# Patient Record
Sex: Male | Born: 2000 | Race: Black or African American | Hispanic: No | Marital: Single
Health system: Southern US, Community
[De-identification: ages and names within clinical notes are randomized; demographics above are authoritative.]

## PROBLEM LIST (undated history)

## (undated) ENCOUNTER — Emergency Department (HOSPITAL_COMMUNITY): Admission: EM | Payer: Medicaid Other | Source: Home / Self Care

## (undated) DIAGNOSIS — Q180 Sinus, fistula and cyst of branchial cleft: Secondary | ICD-10-CM

---
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
## 2022-03-24 ENCOUNTER — Encounter (HOSPITAL_COMMUNITY): Payer: Self-pay

## 2022-03-24 ENCOUNTER — Ambulatory Visit (HOSPITAL_COMMUNITY)
Admission: EM | Admit: 2022-03-24 | Discharge: 2022-03-24 | Disposition: A | Discharge status: Home / Self Care | Payer: Medicaid Other | Attending: Urgent Care | Admitting: Urgent Care

## 2022-03-24 ENCOUNTER — Other Ambulatory Visit: Payer: Self-pay

## 2022-03-24 ENCOUNTER — Ambulatory Visit (INDEPENDENT_AMBULATORY_CARE_PROVIDER_SITE_OTHER): Payer: Medicaid Other

## 2022-03-24 DIAGNOSIS — M25462 Effusion, left knee: Secondary | ICD-10-CM | POA: Diagnosis not present

## 2022-03-24 DIAGNOSIS — S83422A Sprain of lateral collateral ligament of left knee, initial encounter: Secondary | ICD-10-CM

## 2022-03-24 DIAGNOSIS — M25562 Pain in left knee: Secondary | ICD-10-CM

## 2022-03-24 MED ORDER — IBUPROFEN 600 MG PO TABS: 600.0000 mg | ORAL_TABLET | Freq: Four times a day (QID) | ORAL | 0 refills | Status: DC | PRN

## 2022-03-24 NOTE — Discharge Instructions (Addendum)
You do not have a fracture of your knee. You do have a moderate joint effusion, which is suggestive of soft tissue injury. You likely have an LCL sprain.  Please wear the hinged knee brace until follow-up with sports medicine. Please take the ibuprofen every 6-8 hours as needed for mild pain and swelling, take with food to prevent upset stomach. Please ice the knee 3 times daily for the next 3 days.

## 2022-03-24 NOTE — ED Provider Notes (Signed)
[facility]    CSN: [id number] Arrival date & time: 03/24/22  1436      History   Chief Complaint Chief Complaint  Patient presents with   Knee Pain    HPI Alexander Palmer is a 21 y.o. male.   Pleasant 21 year old male presents today due to concerns of left knee pain after landing wrong on his knee while playing basketball yesterday.  He states a similar episode happened 2 months ago, but he never sought medical care at that time.  Patient states he felt a popping sensation, like his knee went out of socket.  He feels that it was dislocated, but states it went back into place.  He took 2 tablets of Tylenol, and has iced his knee yesterday.  He reports the majority of the pain seems to be on the MCL and LCL.  He is able to walk on it.  He reports tightness and swelling.  He denies bruising.   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   History reviewed. No pertinent surgical history.     Home Medications    Prior to Admission medications   Medication Sig Start Date End Date Taking? Authorizing Provider  ibuprofen (ADVIL) 600 MG tablet Take 1 tablet (600 mg total) by mouth every 6 (six) hours as needed for mild pain (swelling). 03/24/22  Yes Derick Seminara L, PA    Family History Family History  Problem Relation Age of Onset   Hypertension Mother    Asthma Father        as a child   Hypertension Maternal Grandmother    Asthma Brother     Social History Social History   Tobacco Use   Smoking status: Passive Smoke Exposure - Never Smoker   Smokeless tobacco: Never   Tobacco comments:    father smokes outside  Substance Use Topics   Alcohol use: No   Drug use: No     Allergies   Patient has no known allergies.   Review of Systems Review of Systems As per HPI  Physical Exam Triage Vital Signs ED Triage Vitals [03/24/22 1518]  Enc  Vitals Group     BP (!) 111/91     Pulse Rate 80     Resp 18     Temp 97.6 F (36.4 C)     Temp Source Oral     SpO2 98 %     Weight      Height      Head Circumference      Peak Flow      Pain Score      Pain Loc      Pain Edu?      Excl. in GC?    No data found.  Updated Vital Signs BP (!) 111/91 (BP Location: Left Arm)   Pulse 80   Temp 97.6 F (36.4 C) (Oral)   Resp 18   SpO2 98%   Visual Acuity Right Eye Distance:   Left Eye Distance:   Bilateral Distance:    Right Eye Near:   Left Eye Near:    Bilateral Near:     Physical Exam Vitals and nursing note reviewed. Exam conducted with a chaperone present.  Constitutional:      General: He is not in acute distress.    Appearance: Normal appearance. He is normal weight.  He is not ill-appearing, toxic-appearing or diaphoretic.  HENT:     Head: Normocephalic and atraumatic.  Musculoskeletal:     Right knee: Normal. No swelling, deformity, effusion, erythema, ecchymosis, lacerations, bony tenderness or crepitus. Normal range of motion. No tenderness. Normal alignment, normal meniscus and normal patellar mobility.     Instability Tests: Anterior drawer test negative. Posterior drawer test negative. Anterior Lachman test negative. Medial McMurray test negative and lateral McMurray test negative.     Left knee: Swelling, effusion and bony tenderness (medial and lateral epicondyle) present. Tenderness present over the MCL and LCL. No medial joint line, lateral joint line, ACL, PCL or patellar tendon tenderness. LCL laxity and MCL laxity present. No ACL laxity or PCL laxity.Normal pulse.     Instability Tests: Anterior drawer test negative. Posterior drawer test negative. Anterior Lachman test negative. Medial McMurray test negative and lateral McMurray test negative.     Right lower leg: Normal. No swelling. No edema.     Left lower leg: Normal. No swelling. No edema.  Neurological:     Mental Status: He is alert.       UC Treatments / Results  Labs (all labs ordered are listed, but only abnormal results are displayed) Labs Reviewed - No data to display  EKG   Radiology DG Knee Complete 4 Views Left  Result Date: 03/24/2022 CLINICAL DATA:  Left knee pain after playing ball yesterday. EXAM: LEFT KNEE - COMPLETE 4+ VIEW COMPARISON:  None Available. FINDINGS: Moderate joint effusion. Joint spaces are preserved. No acute fracture or dislocation. IMPRESSION: Moderate joint effusion. No acute fracture. Electronically Signed   By: Neita Garnet M.D.   On: 03/24/2022 16:08    Procedures Procedures (including critical care time)  Medications Ordered in UC Medications - No data to display  Initial Impression / Assessment and Plan / UC Course  I have reviewed the triage vital signs and the nursing notes.  Pertinent labs & imaging results that were available during my care of the patient were reviewed by me and considered in my medical decision making (see chart for details).     Sprain of LCL -patient most symptomatic at the LCL.  X-ray unremarkable apart from joint effusion.  Suspect this to be secondary to LCL and less severe MCL injury.  Patient had negative signs for ACL PCL or meniscal injury.  We will place patient in hinged knee brace, give antiinflammatory medication, and recommended follow-up with sports medicine to determine if advanced imaging indicated.  Patient is not to participate in sports until seen by specialist. Knee effusion -secondary to above.  Final Clinical Impressions(s) / UC Diagnoses   Final diagnoses:  Sprain of lateral collateral ligament of left knee, initial encounter  Knee effusion, left     Discharge Instructions      You do not have a fracture of your knee. You do have a moderate joint effusion, which is suggestive of soft tissue injury. You likely have an LCL sprain.  Please wear the hinged knee brace until follow-up with sports medicine. Please take the  ibuprofen every 6-8 hours as needed for mild pain and swelling, take with food to prevent upset stomach. Please ice the knee 3 times daily for the next 3 days.   ED Prescriptions     Medication Sig Dispense Auth. Provider   ibuprofen (ADVIL) 600 MG tablet Take 1 tablet (600 mg total) by mouth every 6 (six) hours as needed for mild pain (swelling). 30 tablet [place], [place]  L, PA      PDMP not reviewed this encounter.   Chaney Malling, [place] 03/24/22 2013

## 2022-03-24 NOTE — ED Triage Notes (Signed)
Pt presents with left knee pain after playing ball yesterday. Pt thinks he need is out of place. Pt is taking tylenol for pain.

## 2022-04-02 ENCOUNTER — Ambulatory Visit (INDEPENDENT_AMBULATORY_CARE_PROVIDER_SITE_OTHER): Payer: Medicaid Other | Admitting: Sports Medicine

## 2022-04-02 VITALS — BP 102/60 | Ht 73.0 in | Wt 170.0 lb

## 2022-04-02 DIAGNOSIS — M25562 Pain in left knee: Secondary | ICD-10-CM | POA: Insufficient documentation

## 2022-04-02 NOTE — Assessment & Plan Note (Signed)
He has some obvious swelling and effusion along with some laxity with ACL testing.  MRI ordered today.  He was also recommended he use a compression sleeve, ice and work on quad strengthening exercises.  We will follow-up with him after MRI results have been obtained.

## 2022-04-02 NOTE — Progress Notes (Signed)
New Patient Office Visit  Subjective   Patient ID: Alexander Palmer, male    DOB: 2001/01/25  Age: 21 y.o. MRN: [id number]  Left knee injury.  Neithan presents today with his mother with chief complaint of left knee injury x2.  He first injured his knee about a month ago while playing basketball when he landed funny and felt like his knee moved in and out.  He noticed some swelling at that time but did not seek medical attention.  He rested and his pain resolved.  He again was playing basketball about a week ago when he came down and landed and again felt that his knee shifted underneath him.  He had immediate swelling.  He was evaluated at urgent care the following day.  He was diagnosed with sprain of his ligaments.  X-rays were negative for any acute fracture.  He presents today for follow-up exam.  He denies any buckling of that knee.  Reports no pain just some tightness.  He has been wearing hinged knee brace that he received from urgent care while up and ambulating.  He is not currently in school.  He denies any numbness or tingling radiating down the leg.    ROS as listed above in HPI    Objective:     BP 102/60   Ht 6\' 1"  (1.854 m)   Wt 170 lb (77.1 kg)   BMI 22.43 kg/m   Physical Exam Vitals reviewed.  Constitutional:      General: He is not in acute distress.    Appearance: Normal appearance. He is normal weight. He is not ill-appearing, toxic-appearing or diaphoretic.  HENT:     Head: Normocephalic.  Pulmonary:     Effort: Pulmonary effort is normal.  Musculoskeletal:        General: Swelling (L knee) present.  Neurological:     Mental Status: He is alert.   Left knee: No obvious deformity or asymmetry.  He does have some edema swelling localized to the medial knee.  No tenderness to palpation along medial or lateral joint line or patellar facet.  Slightly decreased range of motion from approximately -5 to 110 degrees.  Stable to varus and valgus stress.  Negative  Thessaly's.  Negative posterior drawer.  Negative sag sign.  There is some laxity with Lachman's and anterior drawer testing when compared to his right side.  Strength 5/5 resisted knee flexion and extension.  He does have some decreased quad tone when compared to his right.  Normal gait    Assessment & Plan:   Problem List Items Addressed This Visit       Other   Acute pain of left knee - Primary    He has some obvious swelling and effusion along with some laxity with ACL testing.  MRI ordered today.  He was also recommended he use a compression sleeve, ice and work on quad strengthening exercises.  We will follow-up with him after MRI results have been obtained.       Relevant Orders   MR Knee Left  Wo Contrast    Return for results after MRI.    Elmore Guise, DO  Patient seen and evaluated with the sports medicine fellow.  I agree with the above plan of care.  There are some laxity with ACL testing and the patient has a definite joint effusion.  X-rays are normal other than the joint effusion that seen.  We will proceed with an MRI scan specifically to rule  out ACL tear.  Patient and his mother will follow-up with [place] in the office after that study to review those results and delineate a definitive treatment plan.

## 2022-04-09 ENCOUNTER — Ambulatory Visit
Admission: RE | Admit: 2022-04-09 | Discharge: 2022-04-09 | Disposition: A | Discharge status: Home / Self Care | Payer: Medicaid Other | Source: Ambulatory Visit | Attending: Sports Medicine | Admitting: Sports Medicine

## 2022-04-09 DIAGNOSIS — M25562 Pain in left knee: Secondary | ICD-10-CM

## 2022-04-16 ENCOUNTER — Ambulatory Visit (INDEPENDENT_AMBULATORY_CARE_PROVIDER_SITE_OTHER): Payer: Medicaid Other | Admitting: Sports Medicine

## 2022-04-16 VITALS — Ht 74.0 in | Wt 170.0 lb

## 2022-04-16 DIAGNOSIS — S83512A Sprain of anterior cruciate ligament of left knee, initial encounter: Secondary | ICD-10-CM | POA: Diagnosis present

## 2022-04-16 NOTE — Progress Notes (Signed)
Patient ID: Alexander Palmer, male   DOB: 02/07/2001, 21 y.o.   MRN: [id number]  Patient presents today to discuss MRI findings of his left knee.  MRI does confirm an ACL tear.  I recommend surgical consultation with Dr. Rhona Raider.  Patient agrees.  I will defer further work-up and treatment to his discretion and the patient will follow-up with me as needed.  This note was dictated using Dragon naturally speaking software and may contain errors in syntax, spelling, or content which have not been identified prior to signing this note.

## 2022-04-16 NOTE — Patient Instructions (Signed)
[facility] Dr Rhona Raider Wed 04/17/22 @ 3:30p Arrive time is [place] [phone]

## 2023-06-21 ENCOUNTER — Other Ambulatory Visit: Payer: Self-pay

## 2023-06-21 ENCOUNTER — Encounter (HOSPITAL_COMMUNITY): Payer: Self-pay

## 2023-06-21 ENCOUNTER — Emergency Department (HOSPITAL_COMMUNITY)
Admission: EM | Admit: 2023-06-21 | Discharge: 2023-06-21 | Disposition: A | Discharge status: Home / Self Care | Payer: Medicaid Other | Attending: Emergency Medicine | Admitting: Emergency Medicine

## 2023-06-21 DIAGNOSIS — R197 Diarrhea, unspecified: Secondary | ICD-10-CM | POA: Insufficient documentation

## 2023-06-21 DIAGNOSIS — R112 Nausea with vomiting, unspecified: Secondary | ICD-10-CM | POA: Diagnosis present

## 2023-06-21 DIAGNOSIS — R1084 Generalized abdominal pain: Secondary | ICD-10-CM | POA: Insufficient documentation

## 2023-06-21 LAB — URINALYSIS, ROUTINE W REFLEX MICROSCOPIC
Bacteria, UA: NONE SEEN
Bilirubin Urine: NEGATIVE
Glucose, UA: NEGATIVE mg/dL
Hgb urine dipstick: NEGATIVE
Ketones, ur: NEGATIVE mg/dL
Nitrite: NEGATIVE
Protein, ur: NEGATIVE mg/dL
Specific Gravity, Urine: 1.024 (ref 1.005–1.030)
pH: 7 (ref 5.0–8.0)

## 2023-06-21 LAB — COMPREHENSIVE METABOLIC PANEL
ALT: 21 U/L (ref 0–44)
AST: 21 U/L (ref 15–41)
Albumin: 4.5 g/dL (ref 3.5–5.0)
Alkaline Phosphatase: 73 U/L (ref 38–126)
Anion gap: 10 (ref 5–15)
BUN: 11 mg/dL (ref 6–20)
CO2: 24 mmol/L (ref 22–32)
Calcium: 9.5 mg/dL (ref 8.9–10.3)
Chloride: 101 mmol/L (ref 98–111)
Creatinine, Ser: 0.94 mg/dL (ref 0.61–1.24)
GFR, Estimated: >60 mL/min (ref >60)
Glucose, Bld: 109 mg/dL — ABNORMAL HIGH (ref 70–99)
Potassium: 3.5 mmol/L (ref 3.5–5.1)
Sodium: 135 mmol/L (ref 135–145)
Total Bilirubin: 2.6 mg/dL — ABNORMAL HIGH (ref <1.2)
Total Protein: 7.3 g/dL (ref 6.5–8.1)

## 2023-06-21 LAB — CBC WITH DIFFERENTIAL/PLATELET
Abs Immature Granulocytes: 0.03 10*3/uL (ref 0.00–0.07)
Basophils Absolute: 0 10*3/uL (ref 0.0–0.1)
Basophils Relative: 0 %
Eosinophils Absolute: 0 10*3/uL (ref 0.0–0.5)
Eosinophils Relative: 0 %
HCT: 41.6 % (ref 39.0–52.0)
Hemoglobin: 13.9 g/dL (ref 13.0–17.0)
Immature Granulocytes: 0 %
Lymphocytes Relative: 14 %
Lymphs Abs: 1.1 10*3/uL (ref 0.7–4.0)
MCH: 29.6 pg (ref 26.0–34.0)
MCHC: 33.4 g/dL (ref 30.0–36.0)
MCV: 88.7 fL (ref 80.0–100.0)
Monocytes Absolute: 0.4 10*3/uL (ref 0.1–1.0)
Monocytes Relative: 5 %
Neutro Abs: 6.6 10*3/uL (ref 1.7–7.7)
Neutrophils Relative %: 81 %
Platelets: 190 10*3/uL (ref 150–400)
RBC: 4.69 MIL/uL (ref 4.22–5.81)
RDW: 11.3 % — ABNORMAL LOW (ref 11.5–15.5)
WBC: 8.2 10*3/uL (ref 4.0–10.5)
nRBC: 0 % (ref 0.0–0.2)

## 2023-06-21 LAB — I-STAT CHEM 8, ED
BUN: 13 mg/dL (ref 6–20)
Calcium, Ion: 1.03 mmol/L — ABNORMAL LOW (ref 1.15–1.40)
Chloride: 103 mmol/L (ref 98–111)
Creatinine, Ser: 1 mg/dL (ref 0.61–1.24)
Glucose, Bld: 107 mg/dL — ABNORMAL HIGH (ref 70–99)
HCT: 42 % (ref 39.0–52.0)
Hemoglobin: 14.3 g/dL (ref 13.0–17.0)
Potassium: 3.6 mmol/L (ref 3.5–5.1)
Sodium: 139 mmol/L (ref 135–145)
TCO2: 25 mmol/L (ref 22–32)

## 2023-06-21 LAB — LIPASE, BLOOD: Lipase: 19 U/L (ref 11–51)

## 2023-06-21 MED ORDER — ONDANSETRON 4 MG PO TBDP: ORAL_TABLET | ORAL | 0 refills | Status: DC

## 2023-06-21 NOTE — ED Provider Notes (Signed)
[place] EMERGENCY DEPARTMENT AT [facility] Provider Note   CSN: [id number] Arrival date & time: 06/21/23  1041     History  Chief Complaint  Patient presents with   Abdominal Pain    Alexander Palmer is a 22 y.o. male.  22 yo M with a cc of abdominal pain.  This been going on for about 12 to 24 hours.  Diffuse abdominal pain.  Worse with certain positions.  Having some nausea vomiting and diarrhea with it.  Alexander Palmer denies fevers or chills.  Denies suspicious food intake denies recent travel denies anyone around him with a similar illness.  Alexander Palmer did get play hit his abdomen yesterday.  Alexander Palmer said it was with a fist.  Alexander Palmer said it was not very hard.   Abdominal Pain      Home Medications Prior to Admission medications   Medication Sig Start Date End Date Taking? Authorizing Provider  ondansetron (ZOFRAN-ODT) 4 MG disintegrating tablet 4mg  ODT q4 hours prn nausea/vomit 06/21/23  Yes Melene Plan, DO  ibuprofen (ADVIL) 600 MG tablet Take 1 tablet (600 mg total) by mouth every 6 (six) hours as needed for mild pain (swelling). 03/24/22   Guy Sandifer L, PA      Allergies    Patient has no known allergies.    Review of Systems   Review of Systems  Gastrointestinal:  Positive for abdominal pain.    Physical Exam Updated Vital Signs BP 129/80 (BP Location: Right Arm)   Pulse 86   Temp 98.6 F (37 C) (Oral)   Resp 12   Ht 6\' 2"  (1.88 m)   Wt 77.1 kg   SpO2 99%   BMI 21.83 kg/m  Physical Exam Vitals and nursing note reviewed.  Constitutional:      Appearance: Alexander Palmer is well-developed.  HENT:     Head: Normocephalic and atraumatic.  Eyes:     Pupils: Pupils are equal, round, and reactive to light.  Neck:     Vascular: No JVD.  Cardiovascular:     Rate and Rhythm: Normal rate and regular rhythm.     Heart sounds: No murmur heard.    No friction rub. No gallop.  Pulmonary:     Effort: No respiratory distress.     Breath sounds: No wheezing.  Abdominal:     General:  There is no distension.     Tenderness: There is no abdominal tenderness. There is no guarding or rebound.     Comments: Benign abdominal exam  Musculoskeletal:        General: Normal range of motion.     Cervical back: Normal range of motion and neck supple.  Skin:    Coloration: Skin is not pale.     Findings: No rash.  Neurological:     Mental Status: Alexander Palmer is alert and oriented to person, place, and time.  Psychiatric:        Behavior: Behavior normal.     ED Results / Procedures / Treatments   Labs (all labs ordered are listed, but only abnormal results are displayed) Labs Reviewed  COMPREHENSIVE METABOLIC PANEL - Abnormal; Notable for the following components:      Result Value   Glucose, Bld 109 (*)    Total Bilirubin 2.6 (*)    All other components within normal limits  CBC WITH DIFFERENTIAL/PLATELET - Abnormal; Notable for the following components:   RDW 11.3 (*)    All other components within normal limits  URINALYSIS, ROUTINE W REFLEX  MICROSCOPIC - Abnormal; Notable for the following components:   APPearance HAZY (*)    Leukocytes,Ua TRACE (*)    All other components within normal limits  I-STAT CHEM 8, ED - Abnormal; Notable for the following components:   Glucose, Bld 107 (*)    Calcium, Ion 1.03 (*)    All other components within normal limits  LIPASE, BLOOD    EKG None  Radiology No results found.  Procedures Procedures    Medications Ordered in ED Medications - No data to display  ED Course/ Medical Decision Making/ A&P                                 Medical Decision Making Risk Prescription drug management.   22 yo M with a cc of abdominal pain.  Diffuse going on for about 12-24 hours. Patient well appearing and non toxic.  No leukocytosis no significant electrolyte abnormalities LFTs and lipase are unremarkable.  UA negative for infection.  Patient was seen in the MSE process and a CT scan was ordered.  I am not sure that this would benefit  the patient based on his history I suspect most likely this is a viral syndrome.  Alexander Palmer has a benign abdominal exam.  No pain with deep palpation in the right lower and right upper quadrant.  Will treat supportively.  PCP follow-up.  2:24 PM:  I have discussed the diagnosis/risks/treatment options with the patient.  Evaluation and diagnostic testing in the emergency department does not suggest an emergent condition requiring admission or immediate intervention beyond what has been performed at this time.  They will follow up with PCP. We also discussed returning to the ED immediately if new or worsening sx occur. We discussed the sx which are most concerning (e.g., sudden worsening pain, fever, inability to tolerate by mouth) that necessitate immediate return. Medications administered to the patient during their visit and any new prescriptions provided to the patient are listed below.  Medications given during this visit Medications - No data to display   The patient appears reasonably screen and/or stabilized for discharge and I doubt any other medical condition or other [facility] requiring further screening, evaluation, or treatment in the ED at this time prior to discharge.          Final Clinical Impression(s) / ED Diagnoses Final diagnoses:  Nausea vomiting and diarrhea    Rx / DC Orders ED Discharge Orders          Ordered    ondansetron (ZOFRAN-ODT) 4 MG disintegrating tablet        06/21/23 1419              Melene Plan, DO 06/21/23 1424

## 2023-06-21 NOTE — ED Provider Triage Note (Signed)
Emergency Medicine Provider Triage Evaluation Note  Alexander Palmer , a 22 y.o. male  was evaluated in triage.  Pt complains of abdominal pain.  Review of Systems  Positive:  Negative:   Physical Exam  BP 129/80 (BP Location: Right Arm)   Pulse 86   Temp 98.6 F (37 C) (Oral)   Resp 12   Ht 6\' 2"  (1.88 m)   Wt 77.1 kg   SpO2 99%   BMI 21.83 kg/m  Gen:   Awake, no distress   Resp:  Normal effort  MSK:   Moves extremities without difficulty  Other:    Medical Decision Making  Medically screening exam initiated at 11:14 AM.  Appropriate orders placed.  Alexander Palmer was informed that the remainder of the evaluation will be completed by another provider, this initial triage assessment does not replace that evaluation, and the importance of remaining in the ED until their evaluation is complete.  Was playing with brother last night when he got hit in stomach. Complaining of lower abdominal pain - does not radiate into testicles. Had vomiting last night and this morning that has since resolved. No nausea currently. Denies hematemesis. Also reports "a little bit" of diarrhea. 8/10 severity currently. Patient does not appear to be in distress.  Denies fever, chest pain, dyspnea, cough, diarrhea, dysuria, hematuria, hematochezia.   Dorthy Cooler, [place] 06/21/23 1116

## 2023-06-21 NOTE — ED Triage Notes (Signed)
Reports abd pain after getting hit in the abd by his little brother while play fighting.  Reports he has had n/v and difficulty pooping.  Reports pain in left knee from previous acl tear.

## 2023-06-21 NOTE — Discharge Instructions (Signed)
Most commonly this caused by a virus.  Typically last couple days and then goes away on its own.  I prescribed you some medication to help you with nausea.  You can take Imodium for diarrhea.  Please return for sudden worsening pain fever or inability to eat or drink.

## 2023-06-27 ENCOUNTER — Ambulatory Visit (INDEPENDENT_AMBULATORY_CARE_PROVIDER_SITE_OTHER): Payer: Medicaid Other | Admitting: Sports Medicine

## 2023-06-27 VITALS — BP 110/72 | Ht 74.0 in | Wt 160.0 lb

## 2023-06-27 DIAGNOSIS — S83512D Sprain of anterior cruciate ligament of left knee, subsequent encounter: Secondary | ICD-10-CM

## 2023-06-27 NOTE — Progress Notes (Signed)
   PCP: [facility]  SUBJECTIVE:   HPI:  Patient is a 23 y.o. male here with chief complaint of left knee instability. He has a known complete ACL tear on the left from October 2023. He was referred to ortho for operative management though did not go. He moved away to [place] for college this past year, though has recently returned. He is hoping to have his knee re-evaluated as it has continued to give out on him and feels unstable. He denies much pain. He has been using a compression sleeve recently and feels this provides him support. He is pretty active and plays pickup basketball. He is interested in another referral to orthopedics. No history of knee surgery.  ROS:     See HPI  PERTINENT  PMH / PSH / FH / SH:  Past Medical, Surgical, Social, and Family History Reviewed & Updated in the EMR.  Pertinent findings include:  Non-contributory  No past surgical history on file.  No Known Allergies  OBJECTIVE:  BP 110/72   Ht 6' 2 (1.88 m)   Wt 160 lb (72.6 kg)   BMI 20.54 kg/m   PHYSICAL EXAM:  GEN: Alert and Oriented, NAD, comfortable in exam room RESP: Unlabored respirations, symmetric chest rise PSY: normal mood, congruent affect   Left Knee MSK EXAM: No gross deformity, ecchymoses, swelling. No TTP. FROM with normal strength. Positive anterior drawer and Lachman, no firm ACL endpoint on exam. Negative post drawers. Negative laxity with valgus/varus testing. Negative mcmurrays, apleys. NV intact distally. Normal gait.  MRI Left Knee 03/2022 reviewed:  1. Full-thickness ACL tear with associated bone contusions of the lateral femoral condyle and posterior aspect of the lateral tibial plateau consistent with pivot-shift injury. 2. Large knee joint effusion. 3. Edema along the medial collateral ligament concerning for ligamentous sprain without tear. 4. Intermediate signal of the body of the medial meniscus without discrete tear. 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 We discussed recommended management for this involves surgical reconstruction, especially if the patient is active and wishes to return to sports or activities that involve pivoting, cutting, or jumping. Discussed risks of accelerated cartilage loss and further knee injury, including dislocation with subsequent complications if not repaired. Referral placed to Dr. Cristy at [facility]. Patient and mother's questions were answered and they are in agreement with this plan.  Prentice Niece, MD PGY-4, Sports Medicine Fellow [facility]  Addendum:  I was the preceptor for this visit and available for immediate consultation.  Ludie Littler MD [facility]

## 2023-06-27 NOTE — Patient Instructions (Addendum)
 You have a chronic ACL tear of the left knee. Given your age and activity level I strongly recommend this be repaired surgically. I have placed a referral to [facility] to see Dr. Cristy.   Address: [address], [place], [place] [address] Phone: [phone]  Appt: Tuesday 07/15/23 @ 11 am  If you need to reschedule this appt please contact their office.

## 2023-08-06 ENCOUNTER — Other Ambulatory Visit: Payer: Self-pay

## 2023-08-06 ENCOUNTER — Encounter (HOSPITAL_BASED_OUTPATIENT_CLINIC_OR_DEPARTMENT_OTHER): Payer: Self-pay | Admitting: Orthopaedic Surgery

## 2023-08-11 NOTE — H&P (Signed)
PREOPERATIVE H&P  Chief Complaint: LEFT KNEE ACL TEAR, MEDIAL MENISCUS TEAR  HPI: Alexander Palmer is a 23 y.o. male who is scheduled for, Procedure(s): KNEE ARTHROSCOPY WITH ANTERIOR CRUCIATE LIGAMENT (ACL) RECONSTRUCTION KNEE ARTHROSCOPY WITH MEDIAL MENISECTOMY KNEE ARTHROSCOPY WITH MENISCAL REPAIR.   Alexander Palmer is a healthy 23 year old male here for evaluation of left ACL tear.  He initially injured this playing basketball in October 2023.  He was evaluated at [facility] and had an MRI performed, which showed a complete rupture of his ACL.  He had never had significant pain with this, just instability.  Shortly after that he moved to [place], although he recently returned back to [place].  He works as a Customer service manager.  He has continued to have instability sensation while playing basketball, though he does not have significant pain in the knee.  He was referred over by [facility] for evaluation of ACL reconstruction.    Symptoms are rated as moderate to severe, and have been worsening.  This is significantly impairing activities of daily living.    Please see clinic note for further details on this patient's care.    He has elected for surgical management.   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 No pertinent surgical history. Social History   Socioeconomic History   Marital status: Single    Spouse name: Not on file   Number of children: Not on file   Years of education: Not on file   Highest education level: Not on file  Occupational History   Not on file  Tobacco Use   Smoking status: Passive Smoke Exposure - Never Smoker   Smokeless tobacco: Never   Tobacco comments:    father smokes outside  Vaping Use   Vaping status: Never Used  Substance and Sexual Activity   Alcohol use: No   Drug use: No   Sexual activity: Not on file  Other Topics Concern   Not on file  Social  History Narrative   Not on file   Social Drivers of Health   Financial Resource Strain: Not on File (10/11/2021)   Received from [employer], [employer]    Financial Resource Strain: 0  Food Insecurity: Not on File (03/20/2023)   Received from [facility] Insecurity    Food: 0  Transportation Needs: Not on File (10/11/2021)   Received from [employer], [employer] Needs    Transportation: 0  Physical Activity: Not on File (10/11/2021)   Received from [place], [place]   Physical Activity    Physical Activity: 0  Stress: Not on File (10/11/2021)   Received from [facility], [place]   Stress    Stress: 0  Social Connections: Not on File (03/08/2023)   Received from [employer]   Social Connections    Connectedness: 0   Family History  Problem Relation Age of Onset   Hypertension Mother    Asthma Father        as a child   Hypertension Maternal Grandmother    Asthma Brother    No Known Allergies Prior to Admission medications   Medication Sig Start Date End Date Taking? Authorizing Provider  ibuprofen (ADVIL) 600 MG tablet Take 1 tablet (600 mg total) by mouth every 6 (six) hours as needed for mild pain (swelling). 03/24/22   Crain, Whitney L, PA  ondansetron (ZOFRAN-ODT) 4 MG disintegrating tablet  4mg  ODT q4 hours prn nausea/vomit 06/21/23   Melene Plan, DO    ROS: All other systems have been reviewed and were otherwise negative with the exception of those mentioned in the HPI and as above.  Physical Exam: General: Alert, no acute distress Cardiovascular: No pedal edema Respiratory: No cyanosis, no use of accessory musculature GI: No organomegaly, abdomen is soft and non-tender Skin: No lesions in the area of chief complaint Neurologic: Sensation intact distally Psychiatric: Patient is competent for consent with normal mood and affect Lymphatic: No axillary or cervical lymphadenopathy  MUSCULOSKELETAL:  Left knee without effusion or bony deformity.  He is  hypermobile and has range of motion from -15 degrees to 145 degrees.  He has significant laxity without end point on Lachman and anterior drawer.  Firm end point on posterior drawer.  No significant laxity with varus and valgus stress.  Negative McMurray's and Thessaly's.  Knee is nontender to palpation.  Neurovascularly intact distally.    Imaging: MRI demonstrates bucket handle tear of the medial meniscus, complete ACL tear  Assessment: LEFT KNEE ACL TEAR, MEDIAL MENISCUS TEAR  Plan: Plan for Procedure(s): KNEE ARTHROSCOPY WITH ANTERIOR CRUCIATE LIGAMENT (ACL) RECONSTRUCTION KNEE ARTHROSCOPY WITH MEDIAL MENISECTOMY KNEE ARTHROSCOPY WITH MENISCAL REPAIR  The risks benefits and alternatives were discussed with the patient including but not limited to the risks of nonoperative treatment, versus surgical intervention including infection, bleeding, nerve injury,  blood clots, cardiopulmonary complications, morbidity, mortality, among others, and they were willing to proceed.   The patient acknowledged the explanation, agreed to proceed with the plan and consent was signed.   Operative Plan: Left knee scope with BTB ACL reconstruction and medial meniscus repair versus meniscectomy Discharge Medications: standard DVT Prophylaxis: aspirin Physical Therapy: outpatient PT Special Discharge needs: [place] (need to order). [employer]   Vernetta Honey, PA-C  08/11/2023 3:17 PM

## 2023-08-13 NOTE — Discharge Instructions (Signed)
Ramond Marrow MD, MPH Alfonse Alpers, PA-C [facility] [address] [phone] (tel)   [phone] (fax)   POST-OPERATIVE INSTRUCTIONS - ACL RECONSTRUCTION  WOUND CARE You may remove the Operative Dressing on Post-Op Day #3 (72hrs after surgery).   Leave steri strips in place.   If you feel more comfortable with it you can leave all dressings in place till your 1 week follow-up appointment.   KEEP THE INCISIONS CLEAN AND DRY. An ACE wrap may be used to control swelling, do not wrap this too tight.  If the initial ACE wrap feels too tight or constricting you may loosen it. There may be a small amount of fluid/bleeding leaking at the surgical site.  This is normal; the knee is filled with fluid during the procedure and can leak for 24-48hrs after surgery.  You may change/reinforce the bandage as needed.  Use the Cryocuff, GameReady or Ice as often as possible for the first 3-4 days, then as needed for pain relief. Always keep a towel, ACE wrap or other barrier between the cooling unit and your skin.  You may shower on Post-Op Day #3. Gently pat the area dry.  Do not soak the knee in water.  Do not go swimming in the pool or ocean until 4 weeks after surgery or when otherwise instructed.  BRACE/AMBULATION Your leg will be placed in a brace post-operatively.  You may remove for hygiene only! You will need to wear your brace at all times until we discuss it further.  It should be locked in full extension (0 degrees) if adjustable.   You will be instructed on further bracing after your first visit. Use crutches for comfort but you can put your full weight on the leg as tolerated.  PHYSICAL THERAPY - You will begin physical therapy soon after surgery (unless otherwise specified) - Please call to set up an appointment, if you do not already have one  - Let our office if there are any issues with scheduling your therapy   - A PT referral was sent to [facility]patient PT  REGIONAL ANESTHESIA (NERVE BLOCKS) The anesthesia team may have performed a nerve block for you this is a great tool used to minimize pain.   The block may start wearing off overnight (between 8-24 hours postop) When the block wears off, your pain may go from nearly zero to the pain you would have had postop without the block. This is an abrupt transition but nothing dangerous is happening.   This can be a challenging period but utilize your as needed pain medications to try and manage this period. We suggest you use the pain medication the first night prior to going to bed, to ease this transition.  You may take an extra dose of narcotic when this happens if needed  POST-OP MEDICATIONS- Multimodal approach to pain control In general your pain will be controlled with a combination of substances.  Prescriptions unless otherwise discussed are electronically sent to your pharmacy.  This is a carefully made plan we use to minimize narcotic use.     Meloxicam - Anti-inflammatory medication taken on a scheduled basis Acetaminophen - Non-narcotic pain medicine taken on a scheduled basis  Oxycodone - This is a strong narcotic, to be used only on an "as needed" basis for SEVERE pain. Aspirin 81mg  - This medicine is used to minimize the risk of blood clots after surgery. Zofran - take as needed for nausea  FOLLOW-UP Please  call the office to schedule a follow-up appointment for your incision check if you do not already have one, 7-10 days post-operatively. IF YOU HAVE ANY QUESTIONS, PLEASE FEEL FREE TO CALL OUR OFFICE.  HELPFUL INFORMATION   Keep your leg elevated to decrease swelling, which will then in turn decrease your pain. I would elevate the foot of your bed by putting a couple of couch pillows between your mattress and box spring. I would not keep pillow directly under your ankle.  You must wear the brace locked while sleeping and ambulating until follow-up.   There will  be MORE swelling on days 1-3 than there is on the day of surgery.  This also is normal. The swelling will decrease with the anti-inflammatory medication, ice and keeping it elevated. The swelling will make it more difficult to bend your knee. As the swelling goes down your motion will become easier  You may develop swelling and bruising that extends from your knee down to your calf and perhaps even to your foot over the next week. Do not be alarmed. This too is normal, and it is due to gravity  There may be some numbness adjacent to the incision site. This may last for 6-12 months or longer in some patients and is expected.  You may return to sedentary work/school in the next couple of days when you feel up to it. You will need to keep your leg elevated as much as possible   You should wean off your narcotic medicines as soon as you are able.  Most patients will be off narcotics before their first postop appointment.   We suggest you use the pain medication the first night prior to going to bed, in order to ease any pain when the anesthesia wears off. You should avoid taking pain medications on an empty stomach as it will make you nauseous.  Do not drink alcoholic beverages or take illicit drugs when taking pain medications.  It is against the law to drive while taking narcotics. You cannot drive if your Right leg is in brace locked in extension.  Pain medication may make you constipated.  Below are a few solutions to try in this order: Decrease the amount of pain medication if you aren't having pain. Drink lots of decaffeinated fluids. Drink prune juice and/or each dried prunes  If the first 3 don't work start with additional solutions Take Colace - an over-the-counter stool softener Take Senokot - an over-the-counter laxative Take Miralax - a stronger over-the-counter laxative  For more information including helpful videos and documents visit our website:    https://[email or web address].com/patient-information.[email or web address]  No ibuprofen until after 7:20 PM No tylenol until after 2:30 PM   Regional Anesthesia Blocks  1. You may not be able to move or feel the "blocked" extremity after a regional anesthetic block. This may last may last from 3-48 hours after placement, but it will go away. The length of time depends on the medication injected and your individual response to the medication. As the nerves start to wake up, you may experience tingling as the movement and feeling returns to your extremity. If the numbness and inability to move your extremity has not gone away after 48 hours, please call your surgeon.   2. The extremity that is blocked will need to be protected until the numbness is gone and the strength has returned. Because you cannot feel it, you will need to take extra care to avoid injury. Because it may be weak, you  may have difficulty moving it or using it. You may not know what position it is in without looking at it while the block is in effect.  3. For blocks in the legs and feet, returning to weight bearing and walking needs to be done carefully. You will need to wait until the numbness is entirely gone and the strength has returned. You should be able to move your leg and foot normally before you try and bear weight or walk. You will need someone to be with you when you first try to ensure you do not fall and possibly risk injury.  4. Bruising and tenderness at the needle site are common side effects and will resolve in a few days.  5. Persistent numbness or new problems with movement should be communicated to the surgeon or the [facility] [phone] [facility] [phone]).

## 2023-08-14 ENCOUNTER — Encounter (HOSPITAL_BASED_OUTPATIENT_CLINIC_OR_DEPARTMENT_OTHER)
Admission: RE | Disposition: A | Discharge status: Home / Self Care | Payer: Self-pay | Source: Home / Self Care | Attending: Orthopaedic Surgery

## 2023-08-14 ENCOUNTER — Other Ambulatory Visit: Payer: Self-pay

## 2023-08-14 ENCOUNTER — Ambulatory Visit (HOSPITAL_BASED_OUTPATIENT_CLINIC_OR_DEPARTMENT_OTHER)
Admission: RE | Admit: 2023-08-14 | Discharge: 2023-08-14 | Disposition: A | Discharge status: Home / Self Care | Payer: Medicaid Other | Attending: Orthopaedic Surgery | Admitting: Orthopaedic Surgery

## 2023-08-14 ENCOUNTER — Encounter (HOSPITAL_BASED_OUTPATIENT_CLINIC_OR_DEPARTMENT_OTHER): Payer: Self-pay | Admitting: Orthopaedic Surgery

## 2023-08-14 ENCOUNTER — Ambulatory Visit (HOSPITAL_BASED_OUTPATIENT_CLINIC_OR_DEPARTMENT_OTHER): Payer: Medicaid Other | Admitting: Certified Registered"

## 2023-08-14 DIAGNOSIS — S83212A Bucket-handle tear of medial meniscus, current injury, left knee, initial encounter: Secondary | ICD-10-CM | POA: Insufficient documentation

## 2023-08-14 DIAGNOSIS — S83212D Bucket-handle tear of medial meniscus, current injury, left knee, subsequent encounter: Secondary | ICD-10-CM | POA: Diagnosis not present

## 2023-08-14 DIAGNOSIS — X58XXXA Exposure to other specified factors, initial encounter: Secondary | ICD-10-CM | POA: Diagnosis not present

## 2023-08-14 DIAGNOSIS — Z7722 Contact with and (suspected) exposure to environmental tobacco smoke (acute) (chronic): Secondary | ICD-10-CM | POA: Diagnosis not present

## 2023-08-14 DIAGNOSIS — S83252A Bucket-handle tear of lateral meniscus, current injury, left knee, initial encounter: Secondary | ICD-10-CM | POA: Diagnosis present

## 2023-08-14 DIAGNOSIS — S83512D Sprain of anterior cruciate ligament of left knee, subsequent encounter: Secondary | ICD-10-CM

## 2023-08-14 DIAGNOSIS — S83512A Sprain of anterior cruciate ligament of left knee, initial encounter: Secondary | ICD-10-CM | POA: Diagnosis not present

## 2023-08-14 DIAGNOSIS — Y9367 Activity, basketball: Secondary | ICD-10-CM | POA: Insufficient documentation

## 2023-08-14 DIAGNOSIS — S83252D Bucket-handle tear of lateral meniscus, current injury, left knee, subsequent encounter: Secondary | ICD-10-CM

## 2023-08-14 DIAGNOSIS — Z01818 Encounter for other preprocedural examination: Secondary | ICD-10-CM

## 2023-08-14 HISTORY — PX: KNEE ARTHROSCOPY WITH MEDIAL MENISECTOMY: SHX5651

## 2023-08-14 SURGERY — KNEE ARTHROSCOPY WITH ANTERIOR CRUCIATE LIGAMENT (ACL) RECONSTRUCTION WITH HAMSTRING GRAFT
Anesthesia: General | Site: Knee | Laterality: Left

## 2023-08-14 MED ORDER — KETOROLAC TROMETHAMINE 30 MG/ML IJ SOLN
INTRAMUSCULAR | Status: DC | PRN
Start: 2023-08-14 — End: 2023-08-14
  Administered 2023-08-14: 30 mg via INTRAVENOUS

## 2023-08-14 MED ORDER — LIDOCAINE 2% (20 MG/ML) 5 ML SYRINGE
INTRAMUSCULAR | Status: AC
  Filled 2023-08-14: qty 5

## 2023-08-14 MED ORDER — OXYCODONE HCL 5 MG/5ML PO SOLN: 5.0000 mg | Freq: Once | ORAL | Status: DC | PRN

## 2023-08-14 MED ORDER — ACETAMINOPHEN 500 MG PO TABS
ORAL_TABLET | ORAL | Status: AC
Start: 2023-08-14 — End: ?
  Filled 2023-08-14: qty 2

## 2023-08-14 MED ORDER — ACETAMINOPHEN 500 MG PO TABS: 1000.0000 mg | ORAL_TABLET | Freq: Three times a day (TID) | ORAL | 0 refills | Status: AC

## 2023-08-14 MED ORDER — FENTANYL CITRATE (PF) 100 MCG/2ML IJ SOLN
50.0000 ug | Freq: Once | INTRAMUSCULAR | Status: AC
  Administered 2023-08-14: 50 ug via INTRAVENOUS

## 2023-08-14 MED ORDER — ONDANSETRON HCL 4 MG/2ML IJ SOLN: 4.0000 mg | Freq: Once | INTRAMUSCULAR | Status: DC | PRN

## 2023-08-14 MED ORDER — PROPOFOL 10 MG/ML IV BOLUS
INTRAVENOUS | Status: DC | PRN
  Administered 2023-08-14: 200 mg via INTRAVENOUS

## 2023-08-14 MED ORDER — DEXAMETHASONE SODIUM PHOSPHATE 10 MG/ML IJ SOLN
INTRAMUSCULAR | Status: DC | PRN
  Administered 2023-08-14: 10 mg via INTRAVENOUS

## 2023-08-14 MED ORDER — CLONIDINE HCL (ANALGESIA) 100 MCG/ML EP SOLN
EPIDURAL | Status: DC | PRN
  Administered 2023-08-14: 50 ug

## 2023-08-14 MED ORDER — DEXMEDETOMIDINE HCL IN NACL 80 MCG/20ML IV SOLN
INTRAVENOUS | Status: DC | PRN
  Administered 2023-08-14 (×3): 4 ug via INTRAVENOUS
  Administered 2023-08-14: 8 ug via INTRAVENOUS

## 2023-08-14 MED ORDER — EPHEDRINE SULFATE (PRESSORS) 50 MG/ML IJ SOLN
INTRAMUSCULAR | Status: DC | PRN
  Administered 2023-08-14: 5 mg via INTRAVENOUS

## 2023-08-14 MED ORDER — SODIUM CHLORIDE 0.9 % IR SOLN
Status: DC | PRN
  Administered 2023-08-14: 9000 mL

## 2023-08-14 MED ORDER — LACTATED RINGERS IV SOLN: INTRAVENOUS | Status: DC

## 2023-08-14 MED ORDER — DEXMEDETOMIDINE HCL IN NACL 80 MCG/20ML IV SOLN
INTRAVENOUS | Status: AC
Start: 2023-08-14 — End: ?
  Filled 2023-08-14: qty 40

## 2023-08-14 MED ORDER — FENTANYL CITRATE (PF) 100 MCG/2ML IJ SOLN
INTRAMUSCULAR | Status: DC | PRN
  Administered 2023-08-14 (×4): 25 ug via INTRAVENOUS

## 2023-08-14 MED ORDER — KETOROLAC TROMETHAMINE 30 MG/ML IJ SOLN
INTRAMUSCULAR | Status: AC
  Filled 2023-08-14: qty 1

## 2023-08-14 MED ORDER — OXYCODONE HCL 5 MG PO TABS: ORAL_TABLET | ORAL | 0 refills | Status: AC

## 2023-08-14 MED ORDER — MIDAZOLAM HCL 2 MG/2ML IJ SOLN
2.0000 mg | Freq: Once | INTRAMUSCULAR | Status: AC
Start: 2023-08-14 — End: 2023-08-14
  Administered 2023-08-14: 2 mg via INTRAVENOUS

## 2023-08-14 MED ORDER — GABAPENTIN 300 MG PO CAPS
300.0000 mg | ORAL_CAPSULE | Freq: Once | ORAL | Status: AC
  Administered 2023-08-14: 300 mg via ORAL

## 2023-08-14 MED ORDER — FENTANYL CITRATE (PF) 100 MCG/2ML IJ SOLN
25.0000 ug | INTRAMUSCULAR | Status: DC | PRN
  Administered 2023-08-14 (×2): 50 ug via INTRAVENOUS

## 2023-08-14 MED ORDER — LIDOCAINE HCL (CARDIAC) PF 100 MG/5ML IV SOSY
PREFILLED_SYRINGE | INTRAVENOUS | Status: DC | PRN
  Administered 2023-08-14: 80 mg via INTRAVENOUS

## 2023-08-14 MED ORDER — ONDANSETRON HCL 4 MG PO TABS: 4.0000 mg | ORAL_TABLET | Freq: Three times a day (TID) | ORAL | 0 refills | Status: AC | PRN

## 2023-08-14 MED ORDER — FENTANYL CITRATE (PF) 100 MCG/2ML IJ SOLN
INTRAMUSCULAR | Status: AC
  Filled 2023-08-14: qty 2

## 2023-08-14 MED ORDER — CEFAZOLIN SODIUM-DEXTROSE 2-4 GM/100ML-% IV SOLN
INTRAVENOUS | Status: AC
  Filled 2023-08-14: qty 100

## 2023-08-14 MED ORDER — ACETAMINOPHEN 500 MG PO TABS
1000.0000 mg | ORAL_TABLET | Freq: Once | ORAL | Status: AC
  Administered 2023-08-14: 1000 mg via ORAL

## 2023-08-14 MED ORDER — CEFAZOLIN SODIUM-DEXTROSE 2-4 GM/100ML-% IV SOLN
2.0000 g | INTRAVENOUS | Status: AC
  Administered 2023-08-14: 2 g via INTRAVENOUS

## 2023-08-14 MED ORDER — ASPIRIN 81 MG PO CHEW
81.0000 mg | CHEWABLE_TABLET | Freq: Two times a day (BID) | ORAL | 0 refills | Status: AC
Start: 2023-08-14 — End: 2023-09-25

## 2023-08-14 MED ORDER — BUPIVACAINE-EPINEPHRINE (PF) 0.5% -1:200000 IJ SOLN
INTRAMUSCULAR | Status: DC | PRN
  Administered 2023-08-14: 25 mL via PERINEURAL

## 2023-08-14 MED ORDER — GABAPENTIN 300 MG PO CAPS
ORAL_CAPSULE | ORAL | Status: AC
  Filled 2023-08-14: qty 1

## 2023-08-14 MED ORDER — ONDANSETRON HCL 4 MG/2ML IJ SOLN
INTRAMUSCULAR | Status: DC | PRN
  Administered 2023-08-14: 4 mg via INTRAVENOUS

## 2023-08-14 MED ORDER — EPHEDRINE 5 MG/ML INJ
INTRAVENOUS | Status: AC
  Filled 2023-08-14: qty 5

## 2023-08-14 MED ORDER — SODIUM CHLORIDE 0.9 % IV SOLN
INTRAVENOUS | Status: DC | PRN
Start: 2023-08-14 — End: 2023-08-14

## 2023-08-14 MED ORDER — OXYCODONE HCL 5 MG PO TABS: 5.0000 mg | ORAL_TABLET | Freq: Once | ORAL | Status: DC | PRN

## 2023-08-14 MED ORDER — VANCOMYCIN HCL 1000 MG IV SOLR
INTRAVENOUS | Status: DC | PRN
  Administered 2023-08-14: 1000 mg via TOPICAL

## 2023-08-14 MED ORDER — MIDAZOLAM HCL 2 MG/2ML IJ SOLN
INTRAMUSCULAR | Status: AC
  Filled 2023-08-14: qty 2

## 2023-08-14 MED ORDER — PROPOFOL 10 MG/ML IV BOLUS
INTRAVENOUS | Status: AC
  Filled 2023-08-14: qty 20

## 2023-08-14 MED ORDER — AMISULPRIDE (ANTIEMETIC) 5 MG/2ML IV SOLN: 10.0000 mg | Freq: Once | INTRAVENOUS | Status: DC | PRN

## 2023-08-14 MED ORDER — MELOXICAM 7.5 MG PO TABS: 7.5000 mg | ORAL_TABLET | Freq: Two times a day (BID) | ORAL | 0 refills | Status: AC

## 2023-08-14 MED ORDER — ONDANSETRON HCL 4 MG/2ML IJ SOLN
INTRAMUSCULAR | Status: AC
  Filled 2023-08-14: qty 2

## 2023-08-14 SURGICAL SUPPLY — 68 items
ANCHOR SWIVELOCK SP KL 4.75 (Anchor) IMPLANT
BANDAGE ESMARK 6X9 LF (GAUZE/BANDAGES/DRESSINGS) IMPLANT
BLADE AVERAGE 25X9 (BLADE) ×1 IMPLANT
BLADE SHAVER BONE 5.0X13 (MISCELLANEOUS) ×1 IMPLANT
BLADE SURG 10 STRL SS (BLADE) ×1 IMPLANT
BLADE SURG 15 STRL LF DISP TIS (BLADE) ×1 IMPLANT
BNDG ELASTIC 6INX 5YD STR LF (GAUZE/BANDAGES/DRESSINGS) ×1 IMPLANT
BNDG ESMARK 6X9 LF (GAUZE/BANDAGES/DRESSINGS)
BURR OVAL 8 FLU 4.0X13 (MISCELLANEOUS) IMPLANT
CHLORAPREP W/TINT 26 (MISCELLANEOUS) ×1 IMPLANT
CLSR STERI-STRIP ANTIMIC 1/2X4 (GAUZE/BANDAGES/DRESSINGS) ×1 IMPLANT
COLLECTOR GRAFT TISSUE (SYSTAGENIX WOUND MANAGEMENT) ×1
COOLER ICEMAN CLASSIC (MISCELLANEOUS) ×1 IMPLANT
COVER BACK TABLE 60X90IN (DRAPES) ×1 IMPLANT
CUFF TRNQT CYL 34X4.125X (TOURNIQUET CUFF) ×1 IMPLANT
CUTTER KNOT PUSHER W/ SKID (INSTRUMENTS) IMPLANT
DISSECTOR 4.0MMX13CM CVD (MISCELLANEOUS) ×1 IMPLANT
DRAPE ARTHROSCOPY W/POUCH 90 (DRAPES) ×1 IMPLANT
DRAPE IMP U-DRAPE 54X76 (DRAPES) ×1 IMPLANT
DRAPE U-SHAPE 47X51 STRL (DRAPES) ×1 IMPLANT
DRAPE-T ARTHROSCOPY W/POUCH (DRAPES) ×1 IMPLANT
ELECT REM PT RETURN 9FT ADLT (ELECTROSURGICAL) ×1
ELECTRODE REM PT RTRN 9FT ADLT (ELECTROSURGICAL) ×1 IMPLANT
FILTER NEPTUNE SMOKE EVACUATOR (MISCELLANEOUS) IMPLANT
GAUZE SPONGE 4X4 12PLY STRL (GAUZE/BANDAGES/DRESSINGS) ×2 IMPLANT
GLOVE BIO SURGEON STRL SZ 6.5 (GLOVE) ×1 IMPLANT
GLOVE BIOGEL PI IND STRL 6.5 (GLOVE) ×1 IMPLANT
GLOVE BIOGEL PI IND STRL 8 (GLOVE) ×1 IMPLANT
GLOVE ECLIPSE 8.0 STRL XLNG CF (GLOVE) ×2 IMPLANT
GOWN STRL REUS W/ TWL LRG LVL3 (GOWN DISPOSABLE) ×2 IMPLANT
GOWN STRL REUS W/TWL XL LVL3 (GOWN DISPOSABLE) ×1 IMPLANT
IMMOBILIZER KNEE 22 UNIV (SOFTGOODS) IMPLANT
IMMOBILIZER KNEE 24 THIGH 36 (MISCELLANEOUS) IMPLANT
IMMOBILIZER KNEE 24 UNIV (MISCELLANEOUS)
IMPLANT FIBERSTITCH 1.5 CVD (Anchor) IMPLANT
IV NS IRRIG 3000ML ARTHROMATIC (IV SOLUTION) ×4 IMPLANT
KIT SUTLOC MENISCAL ROOT REP (Anchor) IMPLANT
KIT TRANSTIBIAL (DISPOSABLE) ×1 IMPLANT
KIT TURNOVER KIT B (KITS) ×1 IMPLANT
KNIFE GRAFT ACL 10MM 5952 (MISCELLANEOUS) ×1 IMPLANT
KNIFE GRAFT ACL 9MM (MISCELLANEOUS) IMPLANT
MANIFOLD NEPTUNE II (INSTRUMENTS) ×1 IMPLANT
NS IRRIG 1000ML POUR BTL (IV SOLUTION) ×1 IMPLANT
PACK ARTHROSCOPY DSU (CUSTOM PROCEDURE TRAY) ×1 IMPLANT
PACK BASIN DAY SURGERY FS (CUSTOM PROCEDURE TRAY) ×1 IMPLANT
PAD COLD SHLDR WRAP-ON (PAD) ×1 IMPLANT
PENCIL SMOKE EVACUATOR (MISCELLANEOUS) ×1 IMPLANT
PORTAL SKID DEVICE (INSTRUMENTS) IMPLANT
SCREW SHEATHED INTERF 7X25 (Screw) IMPLANT
SCREW SHEATHED INTERF 8X20MM (Screw) ×2 IMPLANT
SLEEVE SCD COMPRESS KNEE MED (STOCKING) ×1 IMPLANT
SPIKE FLUID TRANSFER (MISCELLANEOUS) IMPLANT
SPONGE T-LAP 4X18 ~~LOC~~+RFID (SPONGE) ×1 IMPLANT
SUT 0 FIBERLOOP 38 BLUE TPR ND (SUTURE)
SUT FIBERWIRE #2 38 T-5 BLUE (SUTURE) ×6
SUT MNCRL AB 4-0 PS2 18 (SUTURE) ×1 IMPLANT
SUT PDS AB 0 CT 36 (SUTURE) IMPLANT
SUT VIC AB 0 CT1 27XBRD ANBCTR (SUTURE) ×1 IMPLANT
SUT VIC AB 3-0 SH 27X BRD (SUTURE) ×1 IMPLANT
SUTURE 0 FIBERLP 38 BLU TPR ND (SUTURE) IMPLANT
SUTURE FIBERWR #2 38 T-5 BLUE (SUTURE) ×6 IMPLANT
TISSUE GRAFT COLLECTOR (SYSTAGENIX WOUND MANAGEMENT) ×1 IMPLANT
TOWEL GREEN STERILE FF (TOWEL DISPOSABLE) ×2 IMPLANT
TUBE CONNECTING 20X1/4 (TUBING) ×1 IMPLANT
TUBE SUCTION HIGH CAP CLEAR NV (SUCTIONS) ×1 IMPLANT
TUBING ARTHROSCOPY IRRIG 16FT (MISCELLANEOUS) ×1 IMPLANT
WAND ABLATOR APOLLO I90 (BUR) ×1 IMPLANT
WATER STERILE IRR 1000ML POUR (IV SOLUTION) ×1 IMPLANT

## 2023-08-14 NOTE — Interval H&P Note (Signed)
 All questions answered, patient wants to proceed with procedure. ? ?

## 2023-08-14 NOTE — Anesthesia Postprocedure Evaluation (Signed)
Anesthesia Post Note  Patient: Alexander Palmer  Procedure(s) Performed: KNEE ARTHROSCOPY WITH ANTERIOR CRUCIATE LIGAMENT (ACL) RECONSTRUCTION (Left: Knee) KNEE ARTHROSCOPY WITH MEDIAL MENISECTOMY (Left: Knee)     Patient location during evaluation: PACU Anesthesia Type: General Level of consciousness: awake and alert Pain management: pain level controlled Vital Signs Assessment: post-procedure vital signs reviewed and stable Respiratory status: spontaneous breathing, nonlabored ventilation and respiratory function stable Cardiovascular status: blood pressure returned to baseline and stable Postop Assessment: no apparent nausea or vomiting Anesthetic complications: no   No notable events documented.  Last Vitals:  Vitals:   08/14/23 1245 08/14/23 1311  BP: 118/69 123/66  Pulse: 81 74  Resp: 18 20  Temp:  (!) 36.3 C  SpO2: 95% 99%    Last Pain:  Vitals:   08/14/23 1311  TempSrc:   PainSc: 0-No pain                 Collene Schlichter

## 2023-08-14 NOTE — Anesthesia Procedure Notes (Signed)
Anesthesia Regional Block: Adductor canal block   Pre-Anesthetic Checklist: , timeout performed,  Correct Patient, Correct Site, Correct Laterality,  Correct Procedure, Correct Position, site marked,  Risks and benefits discussed,  Surgical consent,  Pre-op evaluation,  At surgeon's request and post-op pain management  Laterality: Left  Prep: chloraprep       Needles:  Injection technique: Single-shot  Needle Type: Echogenic Needle     Needle Length: 9cm  Needle Gauge: 21     Additional Needles:   Procedures:,,,, ultrasound used (permanent image in chart),,    Narrative:  Start time: 08/14/2023 9:35 AM End time: 08/14/2023 9:42 AM Injection made incrementally with aspirations every 5 mL.  Performed by: Personally  Anesthesiologist: Collene Schlichter, MD  Additional Notes: No pain on injection. No increased resistance to injection. Injection made in 5cc increments.  Good needle visualization.  Patient tolerated procedure well.

## 2023-08-14 NOTE — Anesthesia Procedure Notes (Signed)
Procedure Name: LMA Insertion Date/Time: 08/14/2023 10:13 AM  Performed by: Lauralyn Primes, CRNAPre-anesthesia Checklist: Patient identified, Emergency Drugs available, Suction available and Patient being monitored Patient Re-evaluated:Patient Re-evaluated prior to induction Oxygen Delivery Method: Circle system utilized Preoxygenation: Pre-oxygenation with 100% oxygen Induction Type: IV induction Ventilation: Mask ventilation without difficulty LMA: LMA inserted LMA Size: 5.0 Number of attempts: 1 Airway Equipment and Method: Bite block Placement Confirmation: positive ETCO2 Tube secured with: Tape Dental Injury: Teeth and Oropharynx as per pre-operative assessment

## 2023-08-14 NOTE — Progress Notes (Signed)
Assisted Dr. Desmond Lope with left, adductor canal, ultrasound guided block. Side rails up, monitors on throughout procedure. See vital signs in flow sheet. Tolerated Procedure well.

## 2023-08-14 NOTE — Op Note (Signed)
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  Hyperextension to about 10 degrees.  Medial compartment had a bucket-handle tear of the meniscus involving essentially the entire meniscus and it was clear that it extended near the root.  This was completely irreducible and had to be removed.  Medial compartment had grade 2 changes throughout the medial femoral condyle.  Trochlea and patella were normal.  Lateral compartment had a bucket-handle lateral meniscus tear that had been significantly damaged by repetitive buckling motions.  ACL was chronically torn.  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  Would be a reasonable candidate for meniscal transplant if he had symptoms.  He is at high risk for retear of his ACL secondary to his hypermobility.  Successful completion of the planned procedure.    Post-operative plan: The patient will be weightbearing to tolerance in the knee brace locked in extension.  The patient will be discharged home.  DVT prophylaxis Aspirin 81 mg twice daily for 6 weeks.  Pain control with PRN pain medication preferring oral medicines.  Follow up plan will be scheduled in approximately 7 days for incision check and XR.  Post-Op Diagnosis: Same Surgeons:Primary: Bjorn Pippin, MD Assistants:Caroline McBane, PA-C Location: [facility] OR ROOM 1 Anesthesia: General with adductor canal Antibiotics: Ancef 2 g with local vancomycin powder 1 g at the surgical site Tourniquet time: 55 Estimated Blood Loss: Minimal Complications: None Specimens: None Implants: Implant  Name Type Inv. Item Serial No. Manufacturer Lot No. LRB No. Used Action  SCREW SHEATHED INTERF 8X20MM - [id number] Screw SCREW SHEATHED INTERF 8X20MM  [employer] [id number] Left 1 Implanted  SCREW SHEATHED INTERF 8X20MM - [id number] Screw SCREW SHEATHED INTERF 8X20MM  [employer] [id number] Left 1 Implanted    Indications for Surgery:   Alexander Palmer is a 23 y.o. male with ACL rupture remotely and bucketed medial meniscus tear noted on MRI.  Benefits and risks of operative and nonoperative management were discussed prior to surgery with patient/guardian(s) and informed consent form was completed.  Specific risks including infection, need for additional surgery, retear, continued instability, post meniscectomy syndrome amongst others.   Procedure:   The patient was identified properly. Informed consent was obtained and the surgical site was marked. The patient was taken up to suite where general anesthesia was induced. The patient was placed in the supine position with a post against the surgical leg and a nonsterile tourniquet applied. The surgical leg was then prepped and draped usual sterile fashion.  A standard surgical timeout was performed.  2 standard anterior portals were made and diagnostic arthroscopy performed. Please note the findings as noted above.  We began with the meniscus.  Both the medial and lateral meniscus were damage past the point of repair.  Partial medial and partial lateral meniscectomy were removed.  We began by making an incision along the medial third of the patellar tendon in line with the tendon itself starting at the level of the distal pole of the patella ending 3 cm distal to the insertion on the tubercle. We carried the incision down sharply achieving hemostasis 3 progressed identifying the tissue plane of the peritenon. The created skin  flaps medially and laterally taking care to avoid damage to the superficial skin. This point the peritenon was incised sharply in line  with the tendon and again flaps are created exposing the medial and lateral borders of the tendon. We then took care to ensure that there is appropriate visualization for forearm harvest within our incision using a mobile window technique.  We then used a double bladed scalpel incised the tendon longitudinally 10 mm wide. This incision within the tendon was carried proximal and distally onto the tubercle and proximal wound patella to create a 25 mm bone block from patella and a 27 mm bone block from the tibia.  We made our longitudinal cuts with a saw taking care to not go deeper than 10 mm and rather than make a transverse patella cut we made a bullet style tapered cut at the proximal aspect of the patella to avoid the risk for transverse patella fracture.  The harvest went without issue and graft was taken to the back table.    This point we closed the defect in the patellar tendon after identifying that there was appropriate medial and lateral tendon still intact.   We began arthroscopy and made our lateral and medial portals within our incision on each side of the tendon. Fat pad was resected and diagnostic arthroscopy performed with the findings listed above.   The anterior cruciate ligament stump was debrided utilizing a shaver taking great care to preserve the remnant stump on the femur and the tibia for localization of our tunnels. Once the remnant anterior cruciate ligament was removed and we obtained appropriate visualization by performing a small notchplasty and confirmed that we had indeed identify the over-the-top position. We made small marks at the location of the aperture of the tibial and femoral tunnels and double checked our location prior to drilling.  We began with a femoral tunnel.  We had taken care to make a far medial and low anteromedial portal with spinal needle localization to ensure that we can get appropriate position on the lateral wall of the notch from an anterior medial  portal drilling technique.  We used a 7 mm offset guide with the knee at 90 degrees of flexion to mark in the position of the old ACL stump.  We then switched our camera to the medial portal and checked that our position was appropriate compared to the back wall.  At that point we hyperflexed the knee and used the 7 mm offset guide again primarily as a sheath to freehand place our wire at the aforementioned mark taking care to exit anterior to the mid femoral line to avoid posterior wall blowout.  Once the wire was advanced through the skin we clamped it and then placed a 10 mm acorn style reamer by hand ensuring that we did not interfere with the medial femoral condyle.  Once it entered the notch we are able to connect it to a reamer and ream to 34 mm of tunnel depth.  We used an [employer] GraftNet device to harvest with a shaver any of the bony debris and cleared bony debris from the tunnel to ensure that we had an easy pass.  We again checked from the medial portal to ensure that we only had a 2 mm posterior wall and appropriate position of our tunnel at the 2:00 position.  At this point we advanced our Beath pin and shuttled a #2 FiberWire for eventual graft passage.  We used a freehand technique to place  a 2.4 guidewire starting the anterior medial tibia with the wire exiting at the tibial attachment of the ACL using the scope and the anterior horn of the lateral meniscus as guides.    We took great care to ensure that our wire was positioned appropriately.  We then reamed with the barrel reamer through our ACL harvest incision taking care to protect the skin.  We harvested bone as we reamed and then completed the reaming into the joint.  Any soft tissue was cleared from the apertures of the tunnel.  We finally checked our tunnel position and apertures once more and were happy were these and proceeded with graft shuttling.  The graft was shuttled in typical fashion and we were able to visualize  entering the femoral tunnel.  We ensured that the cancellous surface of the graft was anterior moving the collagen of the graft as far posterior as possible.  Before advancing the graft into the femoral socket we placed a guidewire for screw fixation.  The graft was then advanced the appropriate depth and we hyperflexed the knee for placement of our screw.  Femoral fixation was with a 7 x 25 mm metal Ar[employer] screw. We obtained good purchase with the screw. We verified arthroscopically that there is no sign of graft impingement on the notch. We then cycled the knee multiple times and turned our attention to the tibia.  Tibia was fixed with a 8 x 20 mm metal [employer] screw and the graft was extremely rotated 90 to anteriorize the tendinous portion within the joint. We achieved good purchase of the graft and there was minimal mismatch.  At this point a gentle Lachman maneuver was performed and there is a stable endpoint and little translation.  Autograft harvested from left over graft prep as well as reamings was used to bone graft the patella as well as the tibial defects the peritenon was closed.  Incision was closed in multilayer fashion with absorbable suture and Steri-Strips placed. Sterile dressing and knee brace were placed and patient taken to PACU without adverse event.    Incisions closed with absorbable suture. The patient was awoken from general anesthesia and taken to the PACU in stable condition without complication.   Alfonse Alpers, PA-C, present and scrubbed throughout the case, critical for completion in a timely fashion, and for retraction, instrumentation, closure.

## 2023-08-14 NOTE — Transfer of Care (Signed)
Immediate Anesthesia Transfer of Care Note  Patient: Alexander Palmer  Procedure(s) Performed: KNEE ARTHROSCOPY WITH ANTERIOR CRUCIATE LIGAMENT (ACL) RECONSTRUCTION (Left: Knee) KNEE ARTHROSCOPY WITH MEDIAL MENISECTOMY (Left: Knee)  Patient Location: PACU  Anesthesia Type:GA combined with regional for post-op pain  Level of Consciousness: drowsy  Airway & Oxygen Therapy: Patient Spontanous Breathing and Patient connected to face mask oxygen  Post-op Assessment: Report given to RN and Post -op Vital signs reviewed and stable  Post vital signs: Reviewed and stable  Last Vitals:  Vitals Value Taken Time  BP 104/47 08/14/23 1155  Temp 36.6 C 08/14/23 1155  Pulse 64 08/14/23 1158  Resp 16 08/14/23 1158  SpO2 100 % 08/14/23 1158  Vitals shown include unfiled device data.  Last Pain:  Vitals:   08/14/23 0824  TempSrc: Temporal  PainSc: 4          Complications: No notable events documented.

## 2023-08-14 NOTE — Anesthesia Preprocedure Evaluation (Signed)
Anesthesia Evaluation  Patient identified by MRN, date of birth, ID band Patient awake    Reviewed: Allergy & Precautions, NPO status , Patient's Chart, lab work & pertinent test results  Airway Mallampati: II  TM Distance: >3 FB Neck ROM: Full    Dental  (+) Teeth Intact, Dental Advisory Given   Pulmonary neg pulmonary ROS   Pulmonary exam normal breath sounds clear to auscultation       Cardiovascular Exercise Tolerance: Good negative cardio ROS Normal cardiovascular exam Rhythm:Regular Rate:Normal     Neuro/Psych negative neurological ROS  negative psych ROS   GI/Hepatic negative GI ROS, Neg liver ROS,,,  Endo/Other  negative endocrine ROS    Renal/GU negative Renal ROS     Musculoskeletal negative musculoskeletal ROS (+)   LEFT KNEE ACL TEAR, MEDIAL MENISCUS TEAR   Abdominal   Peds  Hematology negative hematology ROS (+)   Anesthesia Other Findings Day of surgery medications reviewed with the patient.  Reproductive/Obstetrics                             Anesthesia Physical Anesthesia Plan  ASA: 1  Anesthesia Plan: General   Post-op Pain Management: Tylenol PO (pre-op)*, Regional block* and Gabapentin PO (pre-op)*   Induction: Intravenous  PONV Risk Score and Plan: 3 and Midazolam, Dexamethasone and Ondansetron  Airway Management Planned: LMA  Additional Equipment:   Intra-op Plan:   Post-operative Plan: Extubation in OR  Informed Consent: I have reviewed the patients History and Physical, chart, labs and discussed the procedure including the risks, benefits and alternatives for the proposed anesthesia with the patient or authorized representative who has indicated his/her understanding and acceptance.     Dental advisory given  Plan Discussed with: CRNA  Anesthesia Plan Comments:        Anesthesia Quick Evaluation

## 2023-08-15 ENCOUNTER — Encounter (HOSPITAL_BASED_OUTPATIENT_CLINIC_OR_DEPARTMENT_OTHER): Payer: Self-pay | Admitting: Orthopaedic Surgery

## 2023-08-19 ENCOUNTER — Ambulatory Visit: Payer: Medicaid Other | Attending: Orthopaedic Surgery | Admitting: Physical Therapy

## 2023-08-19 ENCOUNTER — Other Ambulatory Visit: Payer: Self-pay

## 2023-08-19 ENCOUNTER — Encounter: Payer: Self-pay | Admitting: Physical Therapy

## 2023-08-19 DIAGNOSIS — G8929 Other chronic pain: Secondary | ICD-10-CM | POA: Diagnosis present

## 2023-08-19 DIAGNOSIS — R600 Localized edema: Secondary | ICD-10-CM | POA: Insufficient documentation

## 2023-08-19 DIAGNOSIS — M25562 Pain in left knee: Secondary | ICD-10-CM | POA: Diagnosis present

## 2023-08-19 DIAGNOSIS — M6281 Muscle weakness (generalized): Secondary | ICD-10-CM | POA: Insufficient documentation

## 2023-08-19 NOTE — Therapy (Incomplete)
 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   Past Surgical History:  Procedure Laterality Date   KNEE ARTHROSCOPY WITH MEDIAL MENISECTOMY Left 08/14/2023   Procedure: KNEE ARTHROSCOPY WITH MEDIAL MENISECTOMY;  Surgeon: Bjorn Pippin, MD;  Location: [facility];  Service: Orthopedics;  Laterality: Left;   Patient Active Problem List   Diagnosis Date Noted   Acute pain of left knee 04/02/2022    PCP: Genene Churn, MD  REFERRING PROVIDER: Bjorn Pippin, MD  REFERRING DIAG: Left knee arthroscopy with ACL reconstruction and medial and lateral meniscectomy 2/20   THERAPY DIAG:  Chronic pain of left knee  Muscle weakness (generalized)  Localized edema  Rationale for Evaluation and Treatment: Rehabilitation  ONSET DATE: 08/14/2023   SUBJECTIVE:  SUBJECTIVE STATEMENT: Patient reports he injured his knee about a year ago playing basketball and then had ACL surgery on 08/14/2023. He has been feeling pretty good since surgery and has not needed to ice much. He is still walking using the knee immobilizer and crutches.   PERTINENT HISTORY: See PMH above  PAIN:  Are you having pain? No  PRECAUTIONS: Knee - see ACL BTB protocol  RED FLAGS: None   WEIGHT BEARING RESTRICTIONS: No  FALLS:  Has patient fallen in last 6 months? No  PLOF: Independent  PATIENT GOALS: Return to playing  basketball   OBJECTIVE:  Note: Objective measures were completed at Evaluation unless otherwise noted. PATIENT SURVEYS:  LEFS 33/80  COGNITION: Overall cognitive status: Within functional limits for tasks assessed     SENSATION: WFL  EDEMA:  Not formally assessed due to bandage over left anterior knee, patient does exhibit edema of left knee compared to right  MUSCLE LENGTH: Left calf and hamstring tightness  POSTURE:   WFL  PALPATION: Non tender to palpation  LOWER EXTREMITY ROM:  Active ROM Right eval Left eval  Hip flexion    Hip extension    Hip abduction    Hip adduction    Hip internal rotation    Hip external rotation    Knee flexion 145 90  Knee extension 8 hyper 0  Ankle dorsiflexion    Ankle plantarflexion    Ankle inversion    Ankle eversion     (Blank rows = not tested)  LOWER EXTREMITY MMT:  MMT Right eval Left eval  Hip flexion    Hip extension 4 4-  Hip abduction 4 4-  Hip adduction    Hip internal rotation    Hip external rotation    Knee flexion    Knee extension    Ankle dorsiflexion    Ankle plantarflexion    Ankle inversion    Ankle eversion     (Blank rows = not tested)  Eval: patient able to demonstrate  moderate quad activation, unable to perform SLR without extension lag  FUNCTIONAL TESTS:  Not assessed  GAIT: Assistive device utilized: Crutches and knee immobilizer Level of assistance: Modified independence Comments: ambulating with knee immobilizer locked in extension and bilateral axillary crutches                                                                                                              TREATMENT [facility] Adult PT Treatment:                                                DATE: 08/19/2023 Quad set 10 x 5 sec Quad set combined with knee hyperextension using strap 10 x 5 sec Longsitting calf stretch 2 x 30 sec Supine calf stretch with strap 2 x 30 sec SLR with brace donned x 10 Sidelying hip  abduction with brace donned x 10 Prone hip extension with brace donned x 10 Gait training: brace unlocked with bilat axillary crutches, stepping over hurdles with focus on knee flexion/ankle DF with swing and heel strike to toe progression with stance   PATIENT EDUCATION:  Education details: Exam findings, POC, HEP Person educated: Patient and Parent Education method: Programmer, multimedia, Demonstration, Tactile cues, Verbal cues, and Handouts Education comprehension: verbalized understanding, returned demonstration, verbal cues required, tactile cues required, and needs further education  HOME EXERCISE PROGRAM: Access Code: [email or web address]    ASSESSMENT: CLINICAL IMPRESSION: Patient is a 23 y.o. male who was seen today for physical therapy evaluation and treatment for left ACL reconstruction using BTB graft and meniscectomy. Currently he doing very well, demonstrating good quad activation, knee extension and flexion motion. He was unable to perform SLR without lag this visit so he was instructed to remain in knee immobilizer locked in extension until quad control improves. He was able to work on Animator with brace unlocked and bilateral crutches this visit with good tolerance. Patient and his mother were educated on post-op expectations and general timelines.   OBJECTIVE IMPAIRMENTS: Abnormal gait, decreased activity tolerance, decreased balance, decreased ROM, decreased strength, impaired flexibility, and pain.   ACTIVITY LIMITATIONS: lifting, standing, squatting, stairs, and locomotion level  PARTICIPATION LIMITATIONS: community activity  PERSONAL FACTORS: Fitness, Past/current experiences, and Time since onset of injury/illness/exacerbation are also affecting patient's functional outcome.   REHAB POTENTIAL: Good  CLINICAL DECISION MAKING: Stable/uncomplicated  EVALUATION COMPLEXITY: Low   GOALS: Goals reviewed with patient? Yes  SHORT TERM GOALS: Target date: 09/16/2023  Patient  will be I with initial HEP in order to progress with therapy. Baseline: HEP provided at eval Goal status: INITIAL  2.  Patient will demonstrate 120 deg knee flexion AROM without pain in order to progress mobility  Baseline: 90 deg Goal status: INITIAL  3.  Patient will perform SLR without lag and active hyperextension in order to progress quad control and improve weight bearing and gait  Baseline: patient demonstrates extension lag  with SLR Goal status: INITIAL  4. Patient will ambulate without knee immobilizer or crutches, and without gait deviations in order to progress mobility  Baseline: ambulating with bilat crutches and knee immobilizer  Goal status: INITIAL  LONG TERM GOALS: Target date: 10/14/2023  Patient will be I with final HEP to maintain progress from PT. Baseline: HEP provided at eval Goal status: INITIAL  2.  Patient will report LEFS >/= 72/80 in order to indicate improvement in functional status Baseline: 33/80 Goal status: INITIAL  3.  Patient will demonstrate left knee AROM flexion equal to opposite side in order to normalize movement mechanics for return to sports Baseline: 90 deg Goal status: INITIAL  4.  Patient will demonstrate left knee strength >/= 4+/5 MMT in order to progress with his strengthening in therapy and reduce limitations with community access and stair negotiation Baseline: not formally assessed, unable to perform SLR without lag Goal status: INITIAL   5. Patient will demonstrate proper squat technique without pain or limitation in order to progress with functional strengthening and movement mechanics  Baseline: not assessed  Goal status: INITIAL   PLAN: PT FREQUENCY: 1-2x/week  PT DURATION: 8 weeks  PLANNED INTERVENTIONS: 97164- PT Re-evaluation, 97110-Therapeutic exercises, 97530- Therapeutic activity, 97112- Neuromuscular re-education, 97535- Self Care, 40981- Manual therapy, [phone]- Gait training, [phone]- Aquatic Therapy, [phone]-  Electrical stimulation (manual), 97016- Vasopneumatic device, Patient/Family education, Balance training, Stair training, Taping, Dry Needling, Joint mobilization, Joint manipulation, Cryotherapy, and Moist heat  PLAN FOR NEXT SESSION: Review HEP and progress PRN, continue quad activation to achieve active hyperextension, progress SLR as able, progress knee flexion PROM>AAROM>AROM, gait training with brace unlocked and reducing crutch assist as able, hip strengthening, BFR if time allows, vaso post session for edema   Rosana Hoes, PT, DPT, LAT, ATC 08/20/23  8:18 AM Phone: [phone] Fax: [phone]    [facility] Authorization   Choose one: Rehabilitative  Standardized Assessment or Functional Outcome Tool: LEFS  Score or Percent Disability: 33/80  Body Parts Treated (Select each separately):  Knee. Overall deficits/functional limitations for body part selected: severe   If treatment provided at initial evaluation, no treatment charged due to lack of authorization.

## 2023-08-19 NOTE — Patient Instructions (Signed)
 Access Code: [email or web address] URL: [email or web address] Date: 08/19/2023 Prepared by: Rosana Hoes  Exercises - Long Sitting Quad Set  - 5 x daily - 10 reps - 5 seconds hold - Long Sitting Knee Extension with Towel Foot Lift  - 5 x daily - 10 reps - 5 seconds hold - Long Sitting Calf Stretch with Strap  - 1 x daily - 3 reps - 30 seconds hold - Supine Hamstring Stretch with Strap  - 1 x daily - 3 reps - 30 seconds hold - Active Straight Leg Raise with Quad Set  - 1 x daily - 3 sets - 10 reps - Sidelying Hip Abduction  - 1 x daily - 3 sets - 10 reps - Prone Hip Extension  - 1 x daily - 3 sets - 10 reps

## 2023-08-21 ENCOUNTER — Other Ambulatory Visit: Payer: Self-pay

## 2023-08-21 ENCOUNTER — Encounter: Payer: Self-pay | Admitting: Physical Therapy

## 2023-08-21 ENCOUNTER — Ambulatory Visit: Payer: Medicaid Other | Admitting: Physical Therapy

## 2023-08-21 DIAGNOSIS — M25562 Pain in left knee: Secondary | ICD-10-CM | POA: Diagnosis not present

## 2023-08-21 DIAGNOSIS — R600 Localized edema: Secondary | ICD-10-CM

## 2023-08-21 DIAGNOSIS — M6281 Muscle weakness (generalized): Secondary | ICD-10-CM

## 2023-08-21 DIAGNOSIS — G8929 Other chronic pain: Secondary | ICD-10-CM

## 2023-08-21 NOTE — Therapy (Signed)
 OUTPATIENT PHYSICAL THERAPY EVALUATION   Patient Name: Alexander Palmer MRN: [id number] DOB:2001/01/10, 23 y.o., male Today's Date: 08/21/2023   END OF SESSION:  PT End of Session - 08/21/23 1444     Visit Number 2    Number of Visits 17    Date for PT Re-Evaluation 10/14/23    Authorization Type [employer]    Authorization - Visit Number 1    Authorization - Number of Visits 8    PT Start Time 1445    PT Stop Time 1542    PT Time Calculation (min) 57 min    Activity Tolerance Patient tolerated treatment well    Behavior During Therapy WFL for tasks assessed/performed              Past Medical History:  Diagnosis Date   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   Past Surgical History:  Procedure Laterality Date   KNEE ARTHROSCOPY WITH MEDIAL MENISECTOMY Left 08/14/2023   Procedure: KNEE ARTHROSCOPY WITH MEDIAL MENISECTOMY;  Surgeon: Bjorn Pippin, MD;  Location: [facility];  Service: Orthopedics;  Laterality: Left;   Patient Active Problem List   Diagnosis Date Noted   Acute pain of left knee 04/02/2022    PCP: Genene Churn, MD  REFERRING PROVIDER: Bjorn Pippin, MD  REFERRING DIAG: Left knee arthroscopy with ACL reconstruction and medial and lateral meniscectomy 2/20   THERAPY DIAG:  Chronic pain of left knee  Muscle weakness (generalized)  Localized edema  Rationale for Evaluation and Treatment: Rehabilitation  ONSET DATE: 08/14/2023   SUBJECTIVE:  SUBJECTIVE STATEMENT: Patient reports he injured his knee about a year ago playing basketball and then had ACL surgery on 08/14/2023. He has been feeling pretty good since surgery and has not needed to ice much. He is still walking using the knee immobilizer and crutches.   PERTINENT HISTORY: See PMH above  PAIN:  Are you having pain? No  PRECAUTIONS: Knee - see ACL BTB protocol  RED FLAGS: None   WEIGHT BEARING RESTRICTIONS: No  FALLS:  Has patient fallen in last 6  months? No  PLOF: Independent  PATIENT GOALS: Return to playing basketball   OBJECTIVE:  Note: Objective measures were completed at Evaluation unless otherwise noted. PATIENT SURVEYS:  LEFS 33/80  EDEMA:  Not formally assessed due to bandage over left anterior knee, patient does exhibit edema of left knee compared to right  MUSCLE LENGTH: Left calf and hamstring tightness  POSTURE:   WFL  PALPATION: Non tender to palpation  LOWER EXTREMITY ROM:  ROM Right eval Left eval Lt 08/21/2023  Hip flexion     Hip extension     Hip abduction     Hip adduction     Hip internal rotation     Hip external rotation     Knee flexion 145 90 100 PROM  Knee extension 8 hyper 0   Ankle dorsiflexion     Ankle plantarflexion     Ankle inversion     Ankle eversion      (Blank rows = not tested)  LOWER EXTREMITY MMT:  MMT Right eval Left eval  Hip flexion    Hip extension 4 4-  Hip abduction 4 4-  Hip adduction    Hip internal rotation    Hip external rotation    Knee flexion    Knee extension    Ankle dorsiflexion    Ankle plantarflexion    Ankle inversion    Ankle eversion     (  Blank rows = not tested)  Eval: patient able to demonstrate moderate quad activation, unable to perform SLR without extension lag  FUNCTIONAL TESTS:  Not assessed  GAIT: Assistive device utilized: Crutches and knee immobilizer Level of assistance: Modified independence Comments: ambulating with knee immobilizer locked in extension and bilateral axillary crutches                                                                                                              TREATMENT [facility] Adult PT Treatment:                                                DATE: 08/21/2023 Patellar mobs all directions in supine Quad set 10 x 5 sec Quad set with heel prop on towel 10 x 5 sec Seated knee flexion PROM 10 x 5 sec Longsitting heel slide knee flexion with strap 10 x 5 sec Electrical  Stimulation Location: Left quad Action: [place] Parameters: Standard pre-set parameters, intensity to elicit muscle contraction Goals: Muscle activation and Muscle strength SLR without brace x 10 Blood flow restriction Position and location of cuff: Supine and proximal thigh Limb occlusion pressure (mmHg):180 Exercise pressure (mmHg): 108 Exercise prescription: 30,15,15,15, reps with 30-60 sec rest Exercise comment:  SLR  PATIENT EDUCATION:  Education details: HEP update Person educated: Patient Education method: Explanation, Demonstration, Tactile cues, Verbal cues, and Handouts Education comprehension: verbalized understanding, returned demonstration, verbal cues required, tactile cues required, and needs further education  HOME EXERCISE PROGRAM: Access Code: [email or web address]    ASSESSMENT: CLINICAL IMPRESSION: Patient tolerated therapy well with no adverse effects. He continues to exhibit good quad activation but is unable to perform SLR without lag so utilized e-stim attended for left quad muscle activation and strengthening, which patient did demonstrate good carryover into SLR. His knee flexion ROM has improved and updated his HEP to progress knee flexion stretching for home. Performed BFR for quad strengthening this visit with good tolerance. Patient was highly encouraged to focus on quad activation, achieving active hyperextension and SLR without lag. Patient would benefit from continued skilled PT to progress his strength and control in order to reduce pain and maximize functional ability.    EVAL: Patient is a 23 y.o. male who was seen today for physical therapy evaluation and treatment for left ACL reconstruction using BTB graft and meniscectomy. Currently he doing very well, demonstrating good quad activation, knee extension and flexion motion. He was unable to perform SLR without lag this visit so he was instructed to remain in knee immobilizer locked in extension until quad control  improves. He was able to work on Animator with brace unlocked and bilateral crutches this visit with good tolerance. Patient and his mother were educated on post-op expectations and general timelines.   OBJECTIVE IMPAIRMENTS: Abnormal gait, decreased activity tolerance, decreased balance, decreased ROM, decreased strength, impaired flexibility, and pain.   ACTIVITY LIMITATIONS: lifting,  standing, squatting, stairs, and locomotion level  PARTICIPATION LIMITATIONS: community activity  PERSONAL FACTORS: Fitness, Past/current experiences, and Time since onset of injury/illness/exacerbation are also affecting patient's functional outcome.    GOALS: Goals reviewed with patient? Yes  SHORT TERM GOALS: Target date: 09/16/2023  Patient will be I with initial HEP in order to progress with therapy. Baseline: HEP provided at eval Goal status: INITIAL  2.  Patient will demonstrate 120 deg knee flexion AROM without pain in order to progress mobility  Baseline: 90 deg Goal status: INITIAL  3.  Patient will perform SLR without lag and active hyperextension in order to progress quad control and improve weight bearing and gait  Baseline: patient demonstrates extension lag with SLR Goal status: INITIAL  4. Patient will ambulate without knee immobilizer or crutches, and without gait deviations in order to progress mobility  Baseline: ambulating with bilat crutches and knee immobilizer  Goal status: INITIAL  LONG TERM GOALS: Target date: 10/14/2023  Patient will be I with final HEP to maintain progress from PT. Baseline: HEP provided at eval Goal status: INITIAL  2.  Patient will report LEFS >/= 72/80 in order to indicate improvement in functional status Baseline: 33/80 Goal status: INITIAL  3.  Patient will demonstrate left knee AROM flexion equal to opposite side in order to normalize movement mechanics for return to sports Baseline: 90 deg Goal status: INITIAL  4.  Patient will  demonstrate left knee strength >/= 4+/5 MMT in order to progress with his strengthening in therapy and reduce limitations with community access and stair negotiation Baseline: not formally assessed, unable to perform SLR without lag Goal status: INITIAL   5. Patient will demonstrate proper squat technique without pain or limitation in order to progress with functional strengthening and movement mechanics  Baseline: not assessed  Goal status: INITIAL   PLAN: PT FREQUENCY: 1-2x/week  PT DURATION: 8 weeks  PLANNED INTERVENTIONS: 97164- PT Re-evaluation, 97110-Therapeutic exercises, 97530- Therapeutic activity, 97112- Neuromuscular re-education, 97535- Self Care, 16109- Manual therapy, [id number]- Gait training, [phone]- Aquatic Therapy, [phone]- Electrical stimulation (manual), 97016- Vasopneumatic device, Patient/Family education, Balance training, Stair training, Taping, Dry Needling, Joint mobilization, Joint manipulation, Cryotherapy, and Moist heat  PLAN FOR NEXT SESSION: Review HEP and progress PRN, continue quad activation to achieve active hyperextension, progress SLR as able, progress knee flexion PROM>AAROM>AROM, gait training with brace unlocked and reducing crutch assist as able, hip strengthening, BFR if time allows, vaso post session for edema   Rosana Hoes, PT, DPT, LAT, ATC 08/21/23  4:00 PM Phone: [phone] Fax: [phone]

## 2023-08-21 NOTE — Patient Instructions (Signed)
 Access Code: [email or web address] URL: [email or web address] Date: 08/21/2023 Prepared by: Rosana Hoes  Exercises - Long Sitting Quad Set  - 5 x daily - 10 reps - 5 seconds hold - Long Sitting Knee Extension with Towel Foot Lift  - 5 x daily - 10 reps - 5 seconds hold - Long Sitting Calf Stretch with Strap  - 1 x daily - 3 reps - 30 seconds hold - Sitting Heel Slide with Towel  - 2-3 x daily - 10 reps - 5 seconds hold - Supine Hamstring Stretch with Strap  - 1 x daily - 3 reps - 30 seconds hold - Active Straight Leg Raise with Quad Set  - 1 x daily - 3 sets - 10 reps - Sidelying Hip Abduction  - 1 x daily - 3 sets - 10 reps - Prone Hip Extension  - 1 x daily - 3 sets - 10 reps

## 2023-08-22 ENCOUNTER — Encounter (HOSPITAL_BASED_OUTPATIENT_CLINIC_OR_DEPARTMENT_OTHER): Payer: Self-pay | Admitting: Orthopaedic Surgery

## 2023-08-25 ENCOUNTER — Other Ambulatory Visit: Payer: Self-pay

## 2023-08-25 ENCOUNTER — Ambulatory Visit: Payer: Medicaid Other | Attending: Orthopaedic Surgery | Admitting: Physical Therapy

## 2023-08-25 ENCOUNTER — Encounter: Payer: Self-pay | Admitting: Physical Therapy

## 2023-08-25 DIAGNOSIS — M6281 Muscle weakness (generalized): Secondary | ICD-10-CM | POA: Insufficient documentation

## 2023-08-25 DIAGNOSIS — R600 Localized edema: Secondary | ICD-10-CM | POA: Diagnosis present

## 2023-08-25 DIAGNOSIS — G8929 Other chronic pain: Secondary | ICD-10-CM | POA: Insufficient documentation

## 2023-08-25 DIAGNOSIS — M25562 Pain in left knee: Secondary | ICD-10-CM | POA: Diagnosis present

## 2023-08-25 NOTE — Therapy (Signed)
 OUTPATIENT PHYSICAL THERAPY TREATMENT   Patient Name: Alexander Palmer MRN: [id number] DOB:June 10, 2001, 23 y.o., male Today's Date: 08/25/2023   END OF SESSION:  PT End of Session - 08/25/23 1203     Visit Number 3    Number of Visits 17    Date for PT Re-Evaluation 10/14/23    Authorization Type [employer]    Authorization Time Period 08/21/2023 - 10/20/2023    Authorization - Visit Number 2    Authorization - Number of Visits 8    PT Start Time 1145    PT Stop Time 1245    PT Time Calculation (min) 60 min    Activity Tolerance Patient tolerated treatment well    Behavior During Therapy [facility] for tasks assessed/performed               Past Medical History:  Diagnosis Date   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   Past Surgical History:  Procedure Laterality Date   KNEE ARTHROSCOPY WITH MEDIAL MENISECTOMY Left 08/14/2023   Procedure: KNEE ARTHROSCOPY WITH MEDIAL MENISECTOMY;  Surgeon: Bjorn Pippin, MD;  Location: [facility];  Service: Orthopedics;  Laterality: Left;   Patient Active Problem List   Diagnosis Date Noted   Acute pain of left knee 04/02/2022    PCP: Genene Churn, MD  REFERRING PROVIDER: Bjorn Pippin, MD  REFERRING DIAG: Left knee arthroscopy with ACL reconstruction and medial and lateral meniscectomy 2/20   THERAPY DIAG:  Chronic pain of left knee  Muscle weakness (generalized)  Localized edema  Rationale for Evaluation and Treatment: Rehabilitation  ONSET DATE: 08/14/2023   SUBJECTIVE:  SUBJECTIVE STATEMENT: Patient reports he continues to do well and is consistent with his exercises. Main issue at this point is just the tightness in front of the knee.  PERTINENT HISTORY: See PMH above  PAIN:  Are you having pain? No  PRECAUTIONS: Knee - see ACL BTB protocol  RED FLAGS: None   WEIGHT BEARING RESTRICTIONS: No  FALLS:  Has patient fallen in last 6 months? No  PLOF: Independent  PATIENT GOALS: Return  to playing basketball   OBJECTIVE:  Note: Objective measures were completed at Evaluation unless otherwise noted. PATIENT SURVEYS:  LEFS 33/80  EDEMA:  Not formally assessed due to bandage over left anterior knee, patient does exhibit edema of left knee compared to right  MUSCLE LENGTH: Left calf and hamstring tightness  PALPATION: Non tender to palpation  LOWER EXTREMITY ROM:  ROM Right eval Left eval Lt 08/21/2023 Lt 08/25/2023  Hip flexion      Hip extension      Hip abduction      Hip adduction      Hip internal rotation      Hip external rotation      Knee flexion 145 90 100 PROM 105 deg PROM  Knee extension 8 hyper 0    Ankle dorsiflexion      Ankle plantarflexion      Ankle inversion      Ankle eversion       (Blank rows = not tested)  LOWER EXTREMITY MMT:  MMT Right eval Left eval  Hip flexion    Hip extension 4 4-  Hip abduction 4 4-  Hip adduction    Hip internal rotation    Hip external rotation    Knee flexion    Knee extension    Ankle dorsiflexion    Ankle plantarflexion    Ankle inversion  Ankle eversion     (Blank rows = not tested)  Eval: patient able to demonstrate moderate quad activation, unable to perform SLR without extension lag  FUNCTIONAL TESTS:  Not assessed  GAIT: Assistive device utilized: Crutches and knee immobilizer Level of assistance: Modified independence Comments: ambulating with knee immobilizer locked in extension and bilateral axillary crutches                                                                                                              TREATMENT [facility] Adult PT Treatment:                                                DATE: 08/25/2023 Recumbent bike partial>full revolutions x 5 min to improve knee flexion Quad set with heel prop on towel 10 x 5 sec Electrical Stimulation Location: Left quad Action: [place] x 10 min Parameters: Standard pre-set parameters, intensity to elicit muscle  contraction Goals: Muscle activation and Muscle strength SLR without brace x 10 Standing TKE with red 2 x 10 Partial squat to elevated table (26") 2 x 10 Forward 4" step-up 2 x 10 Standing heel raise 2 x 15 Vasopneumatic (Game Ready): Location: Left knee Time: 15 min Temperature: 34 deg Pressure: High  PATIENT EDUCATION:  Education details: HEP Person educated: Patient Education method: Programmer, multimedia, Demonstration, Actor cues, Verbal cues Education comprehension: verbalized understanding, returned demonstration, verbal cues required, tactile cues required, and needs further education  HOME EXERCISE PROGRAM: Access Code: [email or web address]    ASSESSMENT: CLINICAL IMPRESSION: Patient tolerated therapy well with no adverse effects. Therapy continued focus on improving quad control and strengthening, knee flexion ROM, and progressing closed chain exercises with good tolerance. He demonstrates much improved quad activation and post e-stim he was able to perform SLR without lag and much better control. Progressed to closed chain quad exercises without increase in knee pain. He does require cueing to maintain equal weight between legs and for proper exercise technique. Patient was instructed he could begin ambulation with brace unlocked and it was set at 105 deg knee flexion. Concluded session with vaso as patient does continue to exhibit left knee swelling. No changes to his HEP this visit. Patient would benefit from continued skilled PT to progress his strength and control in order to reduce pain and maximize functional ability.    EVAL: Patient is a 23 y.o. male who was seen today for physical therapy evaluation and treatment for left ACL reconstruction using BTB graft and meniscectomy. Currently he doing very well, demonstrating good quad activation, knee extension and flexion motion. He was unable to perform SLR without lag this visit so he was instructed to remain in knee immobilizer locked in  extension until quad control improves. He was able to work on Animator with brace unlocked and bilateral crutches this visit with good tolerance. Patient and his mother were educated on post-op expectations and general  timelines.   OBJECTIVE IMPAIRMENTS: Abnormal gait, decreased activity tolerance, decreased balance, decreased ROM, decreased strength, impaired flexibility, and pain.   ACTIVITY LIMITATIONS: lifting, standing, squatting, stairs, and locomotion level  PARTICIPATION LIMITATIONS: community activity  PERSONAL FACTORS: Fitness, Past/current experiences, and Time since onset of injury/illness/exacerbation are also affecting patient's functional outcome.    GOALS: Goals reviewed with patient? Yes  SHORT TERM GOALS: Target date: 09/16/2023  Patient will be I with initial HEP in order to progress with therapy. Baseline: HEP provided at eval Goal status: INITIAL  2.  Patient will demonstrate 120 deg knee flexion AROM without pain in order to progress mobility  Baseline: 90 deg Goal status: INITIAL  3.  Patient will perform SLR without lag and active hyperextension in order to progress quad control and improve weight bearing and gait  Baseline: patient demonstrates extension lag with SLR Goal status: INITIAL  4. Patient will ambulate without knee immobilizer or crutches, and without gait deviations in order to progress mobility  Baseline: ambulating with bilat crutches and knee immobilizer  Goal status: INITIAL  LONG TERM GOALS: Target date: 10/14/2023  Patient will be I with final HEP to maintain progress from PT. Baseline: HEP provided at eval Goal status: INITIAL  2.  Patient will report LEFS >/= 72/80 in order to indicate improvement in functional status Baseline: 33/80 Goal status: INITIAL  3.  Patient will demonstrate left knee AROM flexion equal to opposite side in order to normalize movement mechanics for return to sports Baseline: 90 deg Goal status:  INITIAL  4.  Patient will demonstrate left knee strength >/= 4+/5 MMT in order to progress with his strengthening in therapy and reduce limitations with community access and stair negotiation Baseline: not formally assessed, unable to perform SLR without lag Goal status: INITIAL   5. Patient will demonstrate proper squat technique without pain or limitation in order to progress with functional strengthening and movement mechanics  Baseline: not assessed  Goal status: INITIAL   PLAN: PT FREQUENCY: 1-2x/week  PT DURATION: 8 weeks  PLANNED INTERVENTIONS: 97164- PT Re-evaluation, 97110-Therapeutic exercises, 97530- Therapeutic activity, 97112- Neuromuscular re-education, 97535- Self Care, 01027- Manual therapy, [id number]- Gait training, [phone]- Aquatic Therapy, [phone]- Electrical stimulation (manual), 97016- Vasopneumatic device, Patient/Family education, Balance training, Stair training, Taping, Dry Needling, Joint mobilization, Joint manipulation, Cryotherapy, and Moist heat  PLAN FOR NEXT SESSION: Review HEP and progress PRN, continue quad activation to achieve active hyperextension, progress SLR as able, progress knee flexion PROM>AAROM>AROM, gait training with brace unlocked and reducing crutch assist as able, hip strengthening, BFR if time allows, vaso post session for edema   Rosana Hoes, PT, DPT, LAT, ATC 08/25/23  12:40 PM Phone: [phone] Fax: [phone]

## 2023-08-28 ENCOUNTER — Ambulatory Visit: Payer: Medicaid Other | Admitting: Physical Therapy

## 2023-08-28 ENCOUNTER — Encounter: Payer: Self-pay | Admitting: Physical Therapy

## 2023-08-28 ENCOUNTER — Other Ambulatory Visit: Payer: Self-pay

## 2023-08-28 DIAGNOSIS — M25562 Pain in left knee: Secondary | ICD-10-CM | POA: Diagnosis not present

## 2023-08-28 DIAGNOSIS — M6281 Muscle weakness (generalized): Secondary | ICD-10-CM

## 2023-08-28 DIAGNOSIS — R600 Localized edema: Secondary | ICD-10-CM

## 2023-08-28 DIAGNOSIS — G8929 Other chronic pain: Secondary | ICD-10-CM

## 2023-08-28 NOTE — Therapy (Signed)
 OUTPATIENT PHYSICAL THERAPY TREATMENT   Patient Name: Alexander Palmer MRN: [id number] DOB:[birth year], 23 y.o., male Today's Date: 08/28/2023   END OF SESSION:  PT End of Session - 08/28/23 1217     Visit Number 4    Number of Visits 17    Date for PT Re-Evaluation 10/14/23    Authorization Type [employer]    Authorization Time Period 08/21/2023 - 10/20/2023    Authorization - Visit Number 3    Authorization - Number of Visits 8    PT Start Time 1100    PT Stop Time 1200    PT Time Calculation (min) 60 min    Activity Tolerance Patient tolerated treatment well    Behavior During Therapy [facility] for tasks assessed/performed                Past Medical History:  Diagnosis Date   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   Past Surgical History:  Procedure Laterality Date   KNEE ARTHROSCOPY WITH MEDIAL MENISECTOMY Left 08/14/2023   Procedure: KNEE ARTHROSCOPY WITH MEDIAL MENISECTOMY;  Surgeon: Bjorn Pippin, MD;  Location: [facility];  Service: Orthopedics;  Laterality: Left;   Patient Active Problem List   Diagnosis Date Noted   Acute pain of left knee 04/02/2022    PCP: Genene Churn, MD  REFERRING PROVIDER: Bjorn Pippin, MD  REFERRING DIAG: Left knee arthroscopy with ACL reconstruction and medial and lateral meniscectomy 2/20   THERAPY DIAG:  Chronic pain of left knee  Muscle weakness (generalized)  Localized edema  Rationale for Evaluation and Treatment: Rehabilitation  ONSET DATE: 08/14/2023   SUBJECTIVE:  SUBJECTIVE STATEMENT: Patient reports he felt good walking with the brace unlocked. No new issues. He feels like his knee is bending better but does feel tight with straightening.  PERTINENT HISTORY: See PMH above  PAIN:  Are you having pain? No  PRECAUTIONS: Knee - see ACL BTB protocol  RED FLAGS: None   WEIGHT BEARING RESTRICTIONS: No  FALLS:  Has patient fallen in last 6 months? No  PLOF: Independent  PATIENT  GOALS: Return to playing basketball   OBJECTIVE:  Note: Objective measures were completed at Evaluation unless otherwise noted. PATIENT SURVEYS:  LEFS 33/80  EDEMA:  Not formally assessed due to bandage over left anterior knee, patient does exhibit edema of left knee compared to right  MUSCLE LENGTH: Left calf and hamstring tightness  PALPATION: Non tender to palpation  LOWER EXTREMITY ROM:  ROM Right eval Left eval Lt 08/21/2023 Lt 08/25/2023 Lt 08/28/2023  Hip flexion       Hip extension       Hip abduction       Hip adduction       Hip internal rotation       Hip external rotation       Knee flexion 145 90 100 PROM 105 deg PROM 115 deg PROM  Knee extension 8 hyper 0   5 hyper PROM  Ankle dorsiflexion       Ankle plantarflexion       Ankle inversion       Ankle eversion        (Blank rows = not tested)  LOWER EXTREMITY MMT:  MMT Right eval Left eval  Hip flexion    Hip extension 4 4-  Hip abduction 4 4-  Hip adduction    Hip internal rotation    Hip external rotation    Knee flexion  Knee extension    Ankle dorsiflexion    Ankle plantarflexion    Ankle inversion    Ankle eversion     (Blank rows = not tested)  Eval: patient able to demonstrate moderate quad activation, unable to perform SLR without extension lag  FUNCTIONAL TESTS:  Not assessed  GAIT: Assistive device utilized: Crutches and knee immobilizer Level of assistance: Modified independence Comments: ambulating with knee immobilizer locked in extension and bilateral axillary crutches                                                                                                              TREATMENT [facility] Adult PT Treatment:                                                DATE: 08/25/2023 Supine knee extension PROM and patellar mobs Quad set with heel prop on towel 10 x 5 sec Prone TKE with bolster x 10 SLR 2 x 10 Standing calf stretch at wall 2 x 30 sec Retro walking on treadmill 3  x 1 min Standing TKE with red 2 x 15 Partial squat to elevated table (26") 2 x 10 Forward 6" step-up 2 x 10 SLS 3 x 20 sec Vasopneumatic (Game Ready): Location: Left knee Time: 15 min Temperature: 34 deg Pressure: High Review HEP to focus on achieving active knee hyperextension, quad strengthening, and control  PATIENT EDUCATION:  Education details: HEP update Person educated: Patient Education method: Explanation, Demonstration, Tactile cues, Verbal cues, Handout Education comprehension: verbalized understanding, returned demonstration, verbal cues required, tactile cues required, and needs further education  HOME EXERCISE PROGRAM: Access Code: [email or web address]    ASSESSMENT: CLINICAL IMPRESSION: Patient tolerated therapy well with no adverse effects. Therapy continues to focus on progressing active knee hyperextension, quad strengthening and control, and initiated some SLS this visit with good tolerance. He does continue to exhibit inability to perform active knee hyperextension (elevate heel from mat with quad set) but passively is able to demonstrate hyperextension. His knee flexion motion has improved this visit. Progressed with weight bearing exercises this visit and patient seems to be progressing well, he did require cueing to avoid weight shift with squats but he was able to demonstrate proper technique following cues. Based on current quad control he was instructed he could ambulate without the brace as tolerated. Concluded session with vaso as patient does continue to exhibit left knee swelling. Updated his HEP to progress knee motion and quad strengthening. Patient would benefit from continued skilled PT to progress his mobility and strength in order to reduce pain and maximize functional ability.   EVAL: Patient is a 23 y.o. male who was seen today for physical therapy evaluation and treatment for left ACL reconstruction using BTB graft and meniscectomy. Currently he doing very well,  demonstrating good quad activation, knee extension and flexion motion. He was unable to perform SLR without lag  this visit so he was instructed to remain in knee immobilizer locked in extension until quad control improves. He was able to work on Animator with brace unlocked and bilateral crutches this visit with good tolerance. Patient and his mother were educated on post-op expectations and general timelines.   OBJECTIVE IMPAIRMENTS: Abnormal gait, decreased activity tolerance, decreased balance, decreased ROM, decreased strength, impaired flexibility, and pain.   ACTIVITY LIMITATIONS: lifting, standing, squatting, stairs, and locomotion level  PARTICIPATION LIMITATIONS: community activity  PERSONAL FACTORS: Fitness, Past/current experiences, and Time since onset of injury/illness/exacerbation are also affecting patient's functional outcome.    GOALS: Goals reviewed with patient? Yes  SHORT TERM GOALS: Target date: 09/16/2023  Patient will be I with initial HEP in order to progress with therapy. Baseline: HEP provided at eval Goal status: INITIAL  2.  Patient will demonstrate 120 deg knee flexion AROM without pain in order to progress mobility  Baseline: 90 deg Goal status: INITIAL  3.  Patient will perform SLR without lag and active hyperextension in order to progress quad control and improve weight bearing and gait  Baseline: patient demonstrates extension lag with SLR Goal status: INITIAL  4. Patient will ambulate without knee immobilizer or crutches, and without gait deviations in order to progress mobility  Baseline: ambulating with bilat crutches and knee immobilizer  Goal status: INITIAL  LONG TERM GOALS: Target date: 10/14/2023  Patient will be I with final HEP to maintain progress from PT. Baseline: HEP provided at eval Goal status: INITIAL  2.  Patient will report LEFS >/= 72/80 in order to indicate improvement in functional status Baseline: 33/80 Goal status:  INITIAL  3.  Patient will demonstrate left knee AROM flexion equal to opposite side in order to normalize movement mechanics for return to sports Baseline: 90 deg Goal status: INITIAL  4.  Patient will demonstrate left knee strength >/= 4+/5 MMT in order to progress with his strengthening in therapy and reduce limitations with community access and stair negotiation Baseline: not formally assessed, unable to perform SLR without lag Goal status: INITIAL   5. Patient will demonstrate proper squat technique without pain or limitation in order to progress with functional strengthening and movement mechanics  Baseline: not assessed  Goal status: INITIAL   PLAN: PT FREQUENCY: 1-2x/week  PT DURATION: 8 weeks  PLANNED INTERVENTIONS: 97164- PT Re-evaluation, 97110-Therapeutic exercises, 97530- Therapeutic activity, 97112- Neuromuscular re-education, 97535- Self Care, 40981- Manual therapy, [id number]- Gait training, [phone]- Aquatic Therapy, [phone]- Electrical stimulation (manual), 97016- Vasopneumatic device, Patient/Family education, Balance training, Stair training, Taping, Dry Needling, Joint mobilization, Joint manipulation, Cryotherapy, and Moist heat  PLAN FOR NEXT SESSION: Review HEP and progress PRN, continue quad activation to achieve active hyperextension, progress SLR as able, progress knee flexion PROM>AAROM>AROM, gait training with brace unlocked and reducing crutch assist as able, hip strengthening, BFR if time allows, vaso post session for edema   Rosana Hoes, PT, DPT, LAT, ATC 08/28/23  12:26 PM Phone: [phone] Fax: [phone]

## 2023-08-28 NOTE — Patient Instructions (Signed)
 Access Code: [email or web address] URL: [email or web address] Date: 08/28/2023 Prepared by: Rosana Hoes  Exercises - Long Sitting Quad Set with Towel Roll Under Heel  - 5 x daily - 10 reps - 5 seconds hold - Long Sitting Quad Set  - 5 x daily - 10 reps - 5 seconds hold - Long Sitting Knee Extension with Towel Foot Lift  - 5 x daily - 10 reps - 5 seconds hold - Sitting Heel Slide with Towel  - 2-3 x daily - 10 reps - 5 seconds hold - Supine Hamstring Stretch with Strap  - 1 x daily - 3 reps - 30 seconds hold - Active Straight Leg Raise with Quad Set  - 1 x daily - 3 sets - 10 reps - Sidelying Hip Abduction  - 1 x daily - 3 sets - 10 reps - Prone Hip Extension  - 1 x daily - 3 sets - 10 reps - Gastroc Stretch on Wall  - 1 x daily - 3 reps - 30 seconds hold - Standing Terminal Knee Extension with Resistance  - 1 x daily - 3 sets - 10 reps - Quarter Squat with Table  - 1 x daily - 3 sets - 10 reps

## 2023-09-02 ENCOUNTER — Encounter: Payer: Self-pay | Admitting: Physical Therapy

## 2023-09-02 ENCOUNTER — Ambulatory Visit: Payer: Medicaid Other | Admitting: Physical Therapy

## 2023-09-02 ENCOUNTER — Other Ambulatory Visit: Payer: Self-pay

## 2023-09-02 DIAGNOSIS — G8929 Other chronic pain: Secondary | ICD-10-CM

## 2023-09-02 DIAGNOSIS — M25562 Pain in left knee: Secondary | ICD-10-CM | POA: Diagnosis not present

## 2023-09-02 DIAGNOSIS — R600 Localized edema: Secondary | ICD-10-CM

## 2023-09-02 DIAGNOSIS — M6281 Muscle weakness (generalized): Secondary | ICD-10-CM

## 2023-09-02 NOTE — Therapy (Signed)
 OUTPATIENT PHYSICAL THERAPY TREATMENT   Patient Name: Alexander Palmer MRN: [id number] DOB:Feb 19, 2001, 23 y.o., male Today's Date: 09/02/2023   END OF SESSION:  PT End of Session - 09/02/23 0804     Visit Number 5    Number of Visits 17    Date for PT Re-Evaluation 10/14/23    Authorization Type [facility]    Authorization Time Period 08/21/2023 - 10/20/2023    Authorization - Visit Number 4    Authorization - Number of Visits 8    PT Start Time 0801    PT Stop Time 0856    PT Time Calculation (min) 55 min    Activity Tolerance Patient tolerated treatment well    Behavior During Therapy [facility] for tasks assessed/performed                 Past Medical History:  Diagnosis Date   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   Past Surgical History:  Procedure Laterality Date   KNEE ARTHROSCOPY WITH MEDIAL MENISECTOMY Left 08/14/2023   Procedure: KNEE ARTHROSCOPY WITH MEDIAL MENISECTOMY;  Surgeon: Bjorn Pippin, MD;  Location: [facility];  Service: Orthopedics;  Laterality: Left;   Patient Active Problem List   Diagnosis Date Noted   Acute pain of left knee 04/02/2022    PCP: Genene Churn, MD  REFERRING PROVIDER: Bjorn Pippin, MD  REFERRING DIAG: Left knee arthroscopy with ACL reconstruction and medial and lateral meniscectomy 2/20   THERAPY DIAG:  Chronic pain of left knee  Muscle weakness (generalized)  Localized edema  Rationale for Evaluation and Treatment: Rehabilitation  ONSET DATE: 08/14/2023   SUBJECTIVE:  SUBJECTIVE STATEMENT: Patient reports he is starting to feel a little more normal walking.  PERTINENT HISTORY: See PMH above  PAIN:  Are you having pain? No  PRECAUTIONS: Knee - see ACL BTB protocol  RED FLAGS: None   WEIGHT BEARING RESTRICTIONS: No  FALLS:  Has patient fallen in last 6 months? No  PLOF: Independent  PATIENT GOALS: Return to playing basketball   OBJECTIVE:  Note: Objective measures were  completed at Evaluation unless otherwise noted. PATIENT SURVEYS:  LEFS 33/80  EDEMA:  Not formally assessed due to bandage over left anterior knee, patient does exhibit edema of left knee compared to right  MUSCLE LENGTH: Left calf and hamstring tightness  PALPATION: Non tender to palpation  LOWER EXTREMITY ROM:  ROM Right eval Left eval Lt 08/21/2023 Lt 08/25/2023 Lt 08/28/2023 Left 09/02/2023  Hip flexion        Hip extension        Hip abduction        Hip adduction        Hip internal rotation        Hip external rotation        Knee flexion 145 90 100 PROM 105 deg PROM 115 deg PROM 120 deg PROM  Knee extension 8 hyper 0   5 hyper PROM   Ankle dorsiflexion        Ankle plantarflexion        Ankle inversion        Ankle eversion         (Blank rows = not tested)  LOWER EXTREMITY MMT:  MMT Right eval Left eval  Hip flexion    Hip extension 4 4-  Hip abduction 4 4-  Hip adduction    Hip internal rotation    Hip external rotation    Knee flexion  Knee extension    Ankle dorsiflexion    Ankle plantarflexion    Ankle inversion    Ankle eversion     (Blank rows = not tested)  Eval: patient able to demonstrate moderate quad activation, unable to perform SLR without extension lag  FUNCTIONAL TESTS:  Not assessed  GAIT: Assistive device utilized: Crutches and knee immobilizer Level of assistance: Modified independence Comments: ambulating with knee immobilizer locked in extension and bilateral axillary crutches                                                                                                              TREATMENT [facility] Adult PT Treatment:                                                DATE: 09/02/2023 Recumbent bike L3 x 5 min to improve knee motion and endurance Supine knee extension PROM and patellar mobs Quad set with towel roll under knee 10 x 5 sec Quad set with heel prop on towel 10 x 5 sec Quad set with knee hyperextension using  strap assist 10 x 5 sec Prone TKE with foot on bolster and 2# over knee 15 x 3 sec Standing TKE with red 2 x 15 Partial squat to elevated table (26") 2 x 10 Forward 6" step-up 2 x 10 SLS on Airex 3 x 20 sec Standing heel raises 2 x 20 Blood flow restriction Position and location of cuff: Seated and proximal thigh Limb occlusion pressure (mmHg): 126 Exercise pressure (mmHg):  210 Exercise prescription: 30,15,15,15, reps with 30-60 sec rest Exercise comment: LAQ 90-45 deg  PATIENT EDUCATION:  Education details: HEP Person educated: Patient Education method: Programmer, multimedia, Demonstration, Actor cues, Verbal cues Education comprehension: verbalized understanding, returned demonstration, verbal cues required, tactile cues required, and needs further education  HOME EXERCISE PROGRAM: Access Code: [email or web address]    ASSESSMENT: CLINICAL IMPRESSION: Patient tolerated therapy well with no adverse effects. Therapy continued focus on quad activation and strengthening, and achieving active terminal knee hyperextension. He does exhibit continued passive hyperextension of the left knee but only able to actively extend the knee to neutral. He is progressing with his strengthening and incorporated BFR with limited range LAQ for quad strengthening. Incorporated unstable surface for SLS with no loss of balance. He was encouraged to continue focus on achieving ative knee hyperextension and SLR endurance. No changes made to HEP. Patient would benefit from continued skilled PT to progress his mobility and strength in order to reduce pain and maximize functional ability.   EVAL: Patient is a 23 y.o. male who was seen today for physical therapy evaluation and treatment for left ACL reconstruction using BTB graft and meniscectomy. Currently he doing very well, demonstrating good quad activation, knee extension and flexion motion. He was unable to perform SLR without lag this visit so he was instructed to remain in knee  immobilizer locked in  extension until quad control improves. He was able to work on Animator with brace unlocked and bilateral crutches this visit with good tolerance. Patient and his mother were educated on post-op expectations and general timelines.   OBJECTIVE IMPAIRMENTS: Abnormal gait, decreased activity tolerance, decreased balance, decreased ROM, decreased strength, impaired flexibility, and pain.   ACTIVITY LIMITATIONS: lifting, standing, squatting, stairs, and locomotion level  PARTICIPATION LIMITATIONS: community activity  PERSONAL FACTORS: Fitness, Past/current experiences, and Time since onset of injury/illness/exacerbation are also affecting patient's functional outcome.    GOALS: Goals reviewed with patient? Yes  SHORT TERM GOALS: Target date: 09/16/2023  Patient will be I with initial HEP in order to progress with therapy. Baseline: HEP provided at eval Goal status: INITIAL  2.  Patient will demonstrate 120 deg knee flexion AROM without pain in order to progress mobility  Baseline: 90 deg Goal status: INITIAL  3.  Patient will perform SLR without lag and active hyperextension in order to progress quad control and improve weight bearing and gait  Baseline: patient demonstrates extension lag with SLR Goal status: INITIAL  4. Patient will ambulate without knee immobilizer or crutches, and without gait deviations in order to progress mobility  Baseline: ambulating with bilat crutches and knee immobilizer  Goal status: INITIAL  LONG TERM GOALS: Target date: 10/14/2023  Patient will be I with final HEP to maintain progress from PT. Baseline: HEP provided at eval Goal status: INITIAL  2.  Patient will report LEFS >/= 72/80 in order to indicate improvement in functional status Baseline: 33/80 Goal status: INITIAL  3.  Patient will demonstrate left knee AROM flexion equal to opposite side in order to normalize movement mechanics for return to sports Baseline: 90  deg Goal status: INITIAL  4.  Patient will demonstrate left knee strength >/= 4+/5 MMT in order to progress with his strengthening in therapy and reduce limitations with community access and stair negotiation Baseline: not formally assessed, unable to perform SLR without lag Goal status: INITIAL   5. Patient will demonstrate proper squat technique without pain or limitation in order to progress with functional strengthening and movement mechanics  Baseline: not assessed  Goal status: INITIAL   PLAN: PT FREQUENCY: 1-2x/week  PT DURATION: 8 weeks  PLANNED INTERVENTIONS: 97164- PT Re-evaluation, 97110-Therapeutic exercises, 97530- Therapeutic activity, 97112- Neuromuscular re-education, 97535- Self Care, [id number]- Manual therapy, [id number]- Gait training, [phone]- Aquatic Therapy, [phone]- Electrical stimulation (manual), 97016- Vasopneumatic device, Patient/Family education, Balance training, Stair training, Taping, Dry Needling, Joint mobilization, Joint manipulation, Cryotherapy, and Moist heat  PLAN FOR NEXT SESSION: Review HEP and progress PRN, continue quad activation to achieve active hyperextension, progress SLR as able, progress knee flexion PROM>AAROM>AROM, gait training with brace unlocked and reducing crutch assist as able, hip strengthening, BFR if time allows, vaso post session for edema   Rosana Hoes, PT, DPT, LAT, ATC 09/02/23  9:49 AM Phone: [phone] Fax: [phone]

## 2023-09-05 ENCOUNTER — Other Ambulatory Visit: Payer: Self-pay

## 2023-09-05 ENCOUNTER — Ambulatory Visit: Payer: Medicaid Other | Admitting: Physical Therapy

## 2023-09-05 ENCOUNTER — Encounter: Payer: Self-pay | Admitting: Physical Therapy

## 2023-09-05 DIAGNOSIS — M25562 Pain in left knee: Secondary | ICD-10-CM | POA: Diagnosis not present

## 2023-09-05 DIAGNOSIS — M6281 Muscle weakness (generalized): Secondary | ICD-10-CM

## 2023-09-05 DIAGNOSIS — R600 Localized edema: Secondary | ICD-10-CM

## 2023-09-05 DIAGNOSIS — G8929 Other chronic pain: Secondary | ICD-10-CM

## 2023-09-05 NOTE — Therapy (Signed)
 OUTPATIENT PHYSICAL THERAPY TREATMENT   Patient Name: Alexander Palmer MRN: [id number] DOB:July 19, 2000, 23 y.o., male Today's Date: 09/05/2023   END OF SESSION:  PT End of Session - 09/05/23 0947     Visit Number 6    Number of Visits 17    Date for PT Re-Evaluation 10/14/23    Authorization Type [employer]    Authorization Time Period 08/21/2023 - 10/20/2023    Authorization - Visit Number 5    Authorization - Number of Visits 8    PT Start Time 0935    PT Stop Time 1015    PT Time Calculation (min) 40 min    Activity Tolerance Patient tolerated treatment well    Behavior During Therapy [facility] for tasks assessed/performed                  Past Medical History:  Diagnosis Date   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   Past Surgical History:  Procedure Laterality Date   KNEE ARTHROSCOPY WITH MEDIAL MENISECTOMY Left 08/14/2023   Procedure: KNEE ARTHROSCOPY WITH MEDIAL MENISECTOMY;  Surgeon: Bjorn Pippin, MD;  Location: [facility];  Service: Orthopedics;  Laterality: Left;   Patient Active Problem List   Diagnosis Date Noted   Acute pain of left knee 04/02/2022    PCP: Genene Churn, MD  REFERRING PROVIDER: Bjorn Pippin, MD  REFERRING DIAG: Left knee arthroscopy with ACL reconstruction and medial and lateral meniscectomy 2/20   THERAPY DIAG:  Chronic pain of left knee  Muscle weakness (generalized)  Localized edema  Rationale for Evaluation and Treatment: Rehabilitation  ONSET DATE: 08/14/2023   SUBJECTIVE:  SUBJECTIVE STATEMENT: Patient reports he is doing well. He is consistent with exercises at home but does continue to have difficulty with the hyper extension exercise.  PERTINENT HISTORY: See PMH above  PAIN:  Are you having pain? No  PRECAUTIONS: Knee - see ACL BTB protocol  RED FLAGS: None   WEIGHT BEARING RESTRICTIONS: No  FALLS:  Has patient fallen in last 6 months? No  PLOF: Independent  PATIENT GOALS:  Return to playing basketball   OBJECTIVE:  Note: Objective measures were completed at Evaluation unless otherwise noted. PATIENT SURVEYS:  LEFS 33/80  EDEMA:  Not formally assessed due to bandage over left anterior knee, patient does exhibit edema of left knee compared to right  MUSCLE LENGTH: Left calf and hamstring tightness  PALPATION: Non tender to palpation  LOWER EXTREMITY ROM:  ROM Right eval Left eval Lt 08/21/2023 Lt 08/25/2023 Lt 08/28/2023 Left 09/02/2023  Hip flexion        Hip extension        Hip abduction        Hip adduction        Hip internal rotation        Hip external rotation        Knee flexion 145 90 100 PROM 105 deg PROM 115 deg PROM 120 deg PROM  Knee extension 8 hyper 0   5 hyper PROM   Ankle dorsiflexion        Ankle plantarflexion        Ankle inversion        Ankle eversion         (Blank rows = not tested)  LOWER EXTREMITY MMT:  MMT Right eval Left eval  Hip flexion    Hip extension 4 4-  Hip abduction 4 4-  Hip adduction    Hip internal  rotation    Hip external rotation    Knee flexion    Knee extension    Ankle dorsiflexion    Ankle plantarflexion    Ankle inversion    Ankle eversion     (Blank rows = not tested)  Eval: patient able to demonstrate moderate quad activation, unable to perform SLR without extension lag  FUNCTIONAL TESTS:  Not assessed  GAIT: Assistive device utilized: Crutches and knee immobilizer Level of assistance: Modified independence Comments: ambulating with knee immobilizer locked in extension and bilateral axillary crutches                                                                                                              TREATMENT [facility] Adult PT Treatment:                                                DATE: 09/05/2023 Recumbent bike L3 x 4 min to improve knee motion and endurance Supine knee extension PROM and patellar mobs Quad set with heel prop on towel 10 x 5 sec Quad set  with towel roll under knee 10 x 5 sec SLR 2 x 10 - towel roll under knee for quad set Cybex leg press SL 20# 3 x 8 left Lateral heel tap 4" box 2 x 8 Goblet squat to table with 25# 2 x 10  PATIENT EDUCATION:  Education details: HEP Person educated: Patient Education method: Programmer, multimedia, Demonstration, Actor cues, Verbal cues Education comprehension: verbalized understanding, returned demonstration, verbal cues required, tactile cues required, and needs further education  HOME EXERCISE PROGRAM: Access Code: [email or web address]    ASSESSMENT: CLINICAL IMPRESSION: Patient tolerated therapy well with no adverse effects. Therapy continues to focus primarily on progressing quad strength and knee control with good tolerance. He does exhibit improve control with SLR this visit but remains challenged achieving active hyper extension. Progressed his closed chain strengthening with leg press, lateral heel tap, and weighted partial squats. He does require cueing for knee extension with exercises and he does continue to exhibit mild swelling of the left knee so instructed to continue elevation and icing at home. No changes made to HEP this visit but he was instructed on incorporating some light exercises at the gym and still strengthening for the right leg. Patient would benefit from continued skilled PT to progress his mobility and strength in order to reduce pain and maximize functional ability.   EVAL: Patient is a 23 y.o. male who was seen today for physical therapy evaluation and treatment for left ACL reconstruction using BTB graft and meniscectomy. Currently he doing very well, demonstrating good quad activation, knee extension and flexion motion. He was unable to perform SLR without lag this visit so he was instructed to remain in knee immobilizer locked in extension until quad control improves. He was able to work on Animator with brace unlocked and bilateral crutches this visit  with good tolerance.  Patient and his mother were educated on post-op expectations and general timelines.   OBJECTIVE IMPAIRMENTS: Abnormal gait, decreased activity tolerance, decreased balance, decreased ROM, decreased strength, impaired flexibility, and pain.   ACTIVITY LIMITATIONS: lifting, standing, squatting, stairs, and locomotion level  PARTICIPATION LIMITATIONS: community activity  PERSONAL FACTORS: Fitness, Past/current experiences, and Time since onset of injury/illness/exacerbation are also affecting patient's functional outcome.    GOALS: Goals reviewed with patient? Yes  SHORT TERM GOALS: Target date: 09/16/2023  Patient will be I with initial HEP in order to progress with therapy. Baseline: HEP provided at eval Goal status: INITIAL  2.  Patient will demonstrate 120 deg knee flexion AROM without pain in order to progress mobility  Baseline: 90 deg Goal status: INITIAL  3.  Patient will perform SLR without lag and active hyperextension in order to progress quad control and improve weight bearing and gait  Baseline: patient demonstrates extension lag with SLR Goal status: INITIAL  4. Patient will ambulate without knee immobilizer or crutches, and without gait deviations in order to progress mobility  Baseline: ambulating with bilat crutches and knee immobilizer  Goal status: INITIAL  LONG TERM GOALS: Target date: 10/14/2023  Patient will be I with final HEP to maintain progress from PT. Baseline: HEP provided at eval Goal status: INITIAL  2.  Patient will report LEFS >/= 72/80 in order to indicate improvement in functional status Baseline: 33/80 Goal status: INITIAL  3.  Patient will demonstrate left knee AROM flexion equal to opposite side in order to normalize movement mechanics for return to sports Baseline: 90 deg Goal status: INITIAL  4.  Patient will demonstrate left knee strength >/= 4+/5 MMT in order to progress with his strengthening in therapy and reduce limitations with  community access and stair negotiation Baseline: not formally assessed, unable to perform SLR without lag Goal status: INITIAL   5. Patient will demonstrate proper squat technique without pain or limitation in order to progress with functional strengthening and movement mechanics  Baseline: not assessed  Goal status: INITIAL   PLAN: PT FREQUENCY: 1-2x/week  PT DURATION: 8 weeks  PLANNED INTERVENTIONS: 97164- PT Re-evaluation, 97110-Therapeutic exercises, 97530- Therapeutic activity, 97112- Neuromuscular re-education, 97535- Self Care, 16109- Manual therapy, [id number]- Gait training, [phone]- Aquatic Therapy, [phone]- Electrical stimulation (manual), 97016- Vasopneumatic device, Patient/Family education, Balance training, Stair training, Taping, Dry Needling, Joint mobilization, Joint manipulation, Cryotherapy, and Moist heat  PLAN FOR NEXT SESSION: Review HEP and progress PRN, continue quad activation to achieve active hyperextension, progress SLR as able, progress knee flexion PROM>AAROM>AROM, gait training with brace unlocked and reducing crutch assist as able, hip strengthening, BFR if time allows, vaso post session for edema   Rosana Hoes, PT, DPT, LAT, ATC 09/05/23  11:10 AM Phone: [phone] Fax: [phone]

## 2023-09-09 ENCOUNTER — Other Ambulatory Visit: Payer: Self-pay

## 2023-09-09 ENCOUNTER — Encounter: Payer: Self-pay | Admitting: Physical Therapy

## 2023-09-09 ENCOUNTER — Ambulatory Visit: Payer: Medicaid Other | Admitting: Physical Therapy

## 2023-09-09 DIAGNOSIS — R600 Localized edema: Secondary | ICD-10-CM

## 2023-09-09 DIAGNOSIS — M6281 Muscle weakness (generalized): Secondary | ICD-10-CM

## 2023-09-09 DIAGNOSIS — G8929 Other chronic pain: Secondary | ICD-10-CM

## 2023-09-09 DIAGNOSIS — M25562 Pain in left knee: Secondary | ICD-10-CM | POA: Diagnosis not present

## 2023-09-09 NOTE — Therapy (Signed)
 OUTPATIENT PHYSICAL THERAPY TREATMENT   Patient Name: Alexander Palmer MRN: [id number] DOB:09-11-2000, 23 y.o., male Today's Date: 09/09/2023   END OF SESSION:  PT End of Session - 09/09/23 1032     Visit Number 7    Number of Visits 17    Date for PT Re-Evaluation 10/14/23    Authorization Type [employer]    Authorization Time Period 08/21/2023 - 10/20/2023    Authorization - Visit Number 6    Authorization - Number of Visits 8    PT Start Time 1027    PT Stop Time 1100    PT Time Calculation (min) 33 min    Activity Tolerance Patient tolerated treatment well    Behavior During Therapy [facility] for tasks assessed/performed                   Past Medical History:  Diagnosis Date   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   Past Surgical History:  Procedure Laterality Date   KNEE ARTHROSCOPY WITH MEDIAL MENISECTOMY Left 08/14/2023   Procedure: KNEE ARTHROSCOPY WITH MEDIAL MENISECTOMY;  Surgeon: Bjorn Pippin, MD;  Location: [facility];  Service: Orthopedics;  Laterality: Left;   Patient Active Problem List   Diagnosis Date Noted   Acute pain of left knee 04/02/2022    PCP: Genene Churn, MD  REFERRING PROVIDER: Bjorn Pippin, MD  REFERRING DIAG: Left knee arthroscopy with ACL reconstruction and medial and lateral meniscectomy 2/20   THERAPY DIAG:  Chronic pain of left knee  Muscle weakness (generalized)  Localized edema  Rationale for Evaluation and Treatment: Rehabilitation  ONSET DATE: 08/14/2023   SUBJECTIVE:  SUBJECTIVE STATEMENT: Patient reports he is doing well. He is consistent with exercises at home but does continue to have difficulty with the hyper extension exercise.  PERTINENT HISTORY: See PMH above  PAIN:  Are you having pain? No  PRECAUTIONS: Knee - see ACL BTB protocol  RED FLAGS: None   WEIGHT BEARING RESTRICTIONS: No  FALLS:  Has patient fallen in last 6 months? No  PLOF: Independent  PATIENT GOALS:  Return to playing basketball   OBJECTIVE:  Note: Objective measures were completed at Evaluation unless otherwise noted. PATIENT SURVEYS:  LEFS 33/80  EDEMA:  Not formally assessed due to bandage over left anterior knee, patient does exhibit edema of left knee compared to right  MUSCLE LENGTH: Left calf and hamstring tightness  PALPATION: Non tender to palpation  LOWER EXTREMITY ROM:  ROM Right eval Left eval Lt 08/21/2023 Lt 08/25/2023 Lt 08/28/2023 Left 09/02/2023  Hip flexion        Hip extension        Hip abduction        Hip adduction        Hip internal rotation        Hip external rotation        Knee flexion 145 90 100 PROM 105 deg PROM 115 deg PROM 120 deg PROM  Knee extension 8 hyper 0   5 hyper PROM   Ankle dorsiflexion        Ankle plantarflexion        Ankle inversion        Ankle eversion         (Blank rows = not tested)  LOWER EXTREMITY MMT:  MMT Right eval Left eval  Hip flexion    Hip extension 4 4-  Hip abduction 4 4-  Hip adduction    Hip  internal rotation    Hip external rotation    Knee flexion    Knee extension    Ankle dorsiflexion    Ankle plantarflexion    Ankle inversion    Ankle eversion     (Blank rows = not tested)  Eval: patient able to demonstrate moderate quad activation, unable to perform SLR without extension lag  FUNCTIONAL TESTS:  Not assessed  GAIT: Assistive device utilized: Crutches and knee immobilizer Level of assistance: Modified independence Comments: ambulating with knee immobilizer locked in extension and bilateral axillary crutches                                                                                                              TREATMENT [facility] Adult PT Treatment:                                                DATE: 09/05/2023 Recumbent bike L3 x 3 min to improve knee motion and endurance Quad set with heel prop on towel 10 x 5 sec Retro walking with sled 80# x 2 lengths down/back - focus  on active knee extension TRX squat 2 x 10 - focus on active knee extension Standing TKE with red 2 x 10 Quad set with towel roll under knee 10 x 5 sec Lateral heel tap 6" box 2 x 8  PATIENT EDUCATION:  Education details: HEP Person educated: Patient Education method: Programmer, multimedia, Demonstration, Actor cues, Verbal cues Education comprehension: verbalized understanding, returned demonstration, verbal cues required, tactile cues required, and needs further education  HOME EXERCISE PROGRAM: Access Code: [email or web address]    ASSESSMENT: CLINICAL IMPRESSION: Patient tolerated therapy well with no adverse effects. Therapy limited on time due to patient arriving late. Therapy focused on progressing quad strength and active knee extension. He does exhibit improved quad activation and control this visit but does have difficulty with active hyperextension. His passive knee extension remains good. No changes made to his HEP. Patient would benefit from continued skilled PT to progress his mobility and strength in order to reduce pain and maximize functional ability.   EVAL: Patient is a 23 y.o. male who was seen today for physical therapy evaluation and treatment for left ACL reconstruction using BTB graft and meniscectomy. Currently he doing very well, demonstrating good quad activation, knee extension and flexion motion. He was unable to perform SLR without lag this visit so he was instructed to remain in knee immobilizer locked in extension until quad control improves. He was able to work on Animator with brace unlocked and bilateral crutches this visit with good tolerance. Patient and his mother were educated on post-op expectations and general timelines.   OBJECTIVE IMPAIRMENTS: Abnormal gait, decreased activity tolerance, decreased balance, decreased ROM, decreased strength, impaired flexibility, and pain.   ACTIVITY LIMITATIONS: lifting, standing, squatting, stairs, and locomotion  level  PARTICIPATION LIMITATIONS: community activity  PERSONAL FACTORS: Fitness, Past/current experiences,  and Time since onset of injury/illness/exacerbation are also affecting patient's functional outcome.    GOALS: Goals reviewed with patient? Yes  SHORT TERM GOALS: Target date: 09/16/2023  Patient will be I with initial HEP in order to progress with therapy. Baseline: HEP provided at eval Goal status: INITIAL  2.  Patient will demonstrate 120 deg knee flexion AROM without pain in order to progress mobility  Baseline: 90 deg Goal status: INITIAL  3.  Patient will perform SLR without lag and active hyperextension in order to progress quad control and improve weight bearing and gait  Baseline: patient demonstrates extension lag with SLR Goal status: INITIAL  4. Patient will ambulate without knee immobilizer or crutches, and without gait deviations in order to progress mobility  Baseline: ambulating with bilat crutches and knee immobilizer  Goal status: INITIAL  LONG TERM GOALS: Target date: 10/14/2023  Patient will be I with final HEP to maintain progress from PT. Baseline: HEP provided at eval Goal status: INITIAL  2.  Patient will report LEFS >/= 72/80 in order to indicate improvement in functional status Baseline: 33/80 Goal status: INITIAL  3.  Patient will demonstrate left knee AROM flexion equal to opposite side in order to normalize movement mechanics for return to sports Baseline: 90 deg Goal status: INITIAL  4.  Patient will demonstrate left knee strength >/= 4+/5 MMT in order to progress with his strengthening in therapy and reduce limitations with community access and stair negotiation Baseline: not formally assessed, unable to perform SLR without lag Goal status: INITIAL   5. Patient will demonstrate proper squat technique without pain or limitation in order to progress with functional strengthening and movement mechanics  Baseline: not assessed  Goal  status: INITIAL   PLAN: PT FREQUENCY: 1-2x/week  PT DURATION: 8 weeks  PLANNED INTERVENTIONS: 97164- PT Re-evaluation, 97110-Therapeutic exercises, 97530- Therapeutic activity, 97112- Neuromuscular re-education, 97535- Self Care, 65784- Manual therapy, [id number]- Gait training, [phone]- Aquatic Therapy, [phone]- Electrical stimulation (manual), 97016- Vasopneumatic device, Patient/Family education, Balance training, Stair training, Taping, Dry Needling, Joint mobilization, Joint manipulation, Cryotherapy, and Moist heat  PLAN FOR NEXT SESSION: Review HEP and progress PRN, continue quad activation to achieve active hyperextension, progress knee flexion PROM>AAROM>AROM, quad strengthening, BFR if time allows, vaso post session for edema   Rosana Hoes, PT, DPT, LAT, ATC 09/09/23  11:16 AM Phone: [phone] Fax: [phone]

## 2023-09-10 NOTE — Therapy (Addendum)
 OUTPATIENT PHYSICAL THERAPY TREATMENT  DISCHARGE   Patient Name: Alexander Palmer MRN: [id number] DOB:27-Oct-2000, 23 y.o., male Today's Date: 09/11/2023   END OF SESSION:  PT End of Session - 09/11/23 0941     Visit Number 8    Number of Visits 17    Date for PT Re-Evaluation 10/14/23    Authorization Type [employer]    Authorization Time Period 08/21/2023 - 10/20/2023    Authorization - Visit Number 7    Authorization - Number of Visits 8    PT Start Time 0932    PT Stop Time 1025    PT Time Calculation (min) 53 min    Activity Tolerance Patient tolerated treatment well    Behavior During Therapy 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   Past Surgical History:  Procedure Laterality Date   KNEE ARTHROSCOPY WITH MEDIAL MENISECTOMY Left 08/14/2023   Procedure: KNEE ARTHROSCOPY WITH MEDIAL MENISECTOMY;  Surgeon: Micheline Ahr, MD;  Location: [facility];  Service: Orthopedics;  Laterality: Left;   Patient Active Problem List   Diagnosis Date Noted   Acute pain of left knee 04/02/2022    PCP: Keith Pat, MD  REFERRING PROVIDER: Micheline Ahr, MD  REFERRING DIAG: Left knee arthroscopy with ACL reconstruction and medial and lateral meniscectomy 2/20   THERAPY DIAG:  Chronic pain of left knee  Muscle weakness (generalized)  Localized edema  Rationale for Evaluation and Treatment: Rehabilitation  ONSET DATE: 08/14/2023   SUBJECTIVE:  SUBJECTIVE STATEMENT: Patient reports his knee is feeling good. He sees his Careers adviser later today. He does report difficulty and mild discomfort when descending stairs.  PERTINENT HISTORY: See PMH above  PAIN:  Are you having pain? No  PRECAUTIONS: Knee - see ACL BTB protocol  RED FLAGS: None   WEIGHT BEARING RESTRICTIONS: No  FALLS:  Has patient fallen in last 6 months? No  PLOF:  Independent  PATIENT GOALS: Return to playing basketball   OBJECTIVE:  Note: Objective measures were completed at Evaluation unless otherwise noted. PATIENT SURVEYS:  LEFS 33/80  EDEMA:  Not formally assessed due to bandage over left anterior knee, patient does exhibit edema of left knee compared to right  MUSCLE LENGTH: Left calf and hamstring tightness  PALPATION: Non tender to palpation  LOWER EXTREMITY ROM:  ROM Right eval Left eval Lt 08/21/2023 Lt 08/25/2023 Lt 08/28/2023 Left 09/02/2023 Left 09/11/2023  Hip flexion         Hip extension         Hip abduction         Hip adduction         Hip internal rotation         Hip external rotation         Knee flexion 145 90 100 PROM 105 deg PROM 115 deg PROM 120 deg PROM 124 deg PROM  Knee extension 8 hyper 0   5 hyper PROM  5 hyper PROM  0 deg AROM  Ankle dorsiflexion         Ankle plantarflexion         Ankle inversion         Ankle eversion          (Blank rows = not tested)  LOWER EXTREMITY MMT:  MMT Right eval  Left eval  Hip flexion    Hip extension 4 4-  Hip abduction 4 4-  Hip adduction    Hip internal rotation    Hip external rotation    Knee flexion    Knee extension    Ankle dorsiflexion    Ankle plantarflexion    Ankle inversion    Ankle eversion     (Blank rows = not tested)  Eval: patient able to demonstrate moderate quad activation, unable to perform SLR without extension lag  FUNCTIONAL TESTS:  Not assessed  GAIT: Assistive device utilized: Crutches and knee immobilizer Level of assistance: Modified independence Comments: ambulating with knee immobilizer locked in extension and bilateral axillary crutches                                                                                                              TREATMENT [facility] Adult PT Treatment:                                                DATE: 09/11/2023 Recumbent bike L3 x 5 min to improve endurance Cybex leg press DL  161# 2 x 10; SL 09# x 8, 40# 2 x 8 (right 80# 3 x 8) Forward 4" heel tap 2 x 10 Standing TKE with skinny power band 2 x 10 SLS on airex with 5# KB pass 3 x 30 sec each Blood flow restriction Position and location of cuff: Seated and proximal thigh Limb occlusion pressure (mmHg): 190 Exercise pressure (mmHg): 114 Exercise prescription: 30,15,15,15, reps with 30-60 sec rest Exercise comment: LAQ full range  PATIENT EDUCATION:  Education details: HEP Person educated: Patient Education method: Programmer, multimedia, Demonstration, Actor cues, Verbal cues Education comprehension: verbalized understanding, returned demonstration, verbal cues required, tactile cues required, and needs further education  HOME EXERCISE PROGRAM: Access Code: [email or web address]    ASSESSMENT: CLINICAL IMPRESSION: Patient tolerated therapy well with no adverse effects. Therapy focused primarily on progressing quad strengthening and control with good tolerance. He does exhibit improvement in his knee flexion ROM and his passive knee extension is good but lacks active knee hyper extension and reports the feeling of weakness at end range extension when in closed chain position. He is progressing with his quad strengthening, tolerating increased weight with SL leg press and progressing with his forward heel tap with good control. Continued using BFR for quad strengthening at end of session. No changes made to HEP but patient was encouraged to incorporate left knee strengthening at gym and ensure he is strengthening the right leg as well. Patient would benefit from continued skilled PT to progress his mobility and strength in order to reduce pain and maximize functional ability.   EVAL: Patient is a 23 y.o. male who was seen today for physical therapy evaluation and treatment for left ACL reconstruction using BTB graft and meniscectomy. Currently he doing very well, demonstrating good quad activation, knee extension  and flexion motion. He  was unable to perform SLR without lag this visit so he was instructed to remain in knee immobilizer locked in extension until quad control improves. He was able to work on Animator with brace unlocked and bilateral crutches this visit with good tolerance. Patient and his mother were educated on post-op expectations and general timelines.   OBJECTIVE IMPAIRMENTS: Abnormal gait, decreased activity tolerance, decreased balance, decreased ROM, decreased strength, impaired flexibility, and pain.   ACTIVITY LIMITATIONS: lifting, standing, squatting, stairs, and locomotion level  PARTICIPATION LIMITATIONS: community activity  PERSONAL FACTORS: Fitness, Past/current experiences, and Time since onset of injury/illness/exacerbation are also affecting patient's functional outcome.    GOALS: Goals reviewed with patient? Yes  SHORT TERM GOALS: Target date: 09/16/2023  Patient will be I with initial HEP in order to progress with therapy. Baseline: HEP provided at eval Goal status: INITIAL  2.  Patient will demonstrate 120 deg knee flexion AROM without pain in order to progress mobility  Baseline: 90 deg Goal status: INITIAL  3.  Patient will perform SLR without lag and active hyperextension in order to progress quad control and improve weight bearing and gait  Baseline: patient demonstrates extension lag with SLR Goal status: INITIAL  4. Patient will ambulate without knee immobilizer or crutches, and without gait deviations in order to progress mobility  Baseline: ambulating with bilat crutches and knee immobilizer  Goal status: INITIAL  LONG TERM GOALS: Target date: 10/14/2023  Patient will be I with final HEP to maintain progress from PT. Baseline: HEP provided at eval Goal status: INITIAL  2.  Patient will report LEFS >/= 72/80 in order to indicate improvement in functional status Baseline: 33/80 Goal status: INITIAL  3.  Patient will demonstrate left knee AROM flexion equal to  opposite side in order to normalize movement mechanics for return to sports Baseline: 90 deg Goal status: INITIAL  4.  Patient will demonstrate left knee strength >/= 4+/5 MMT in order to progress with his strengthening in therapy and reduce limitations with community access and stair negotiation Baseline: not formally assessed, unable to perform SLR without lag Goal status: INITIAL   5. Patient will demonstrate proper squat technique without pain or limitation in order to progress with functional strengthening and movement mechanics  Baseline: not assessed  Goal status: INITIAL   PLAN: PT FREQUENCY: 1-2x/week  PT DURATION: 8 weeks  PLANNED INTERVENTIONS: 97164- PT Re-evaluation, 97110-Therapeutic exercises, 97530- Therapeutic activity, 97112- Neuromuscular re-education, 97535- Self Care, 16109- Manual therapy, [phone]- Gait training, [phone]- Aquatic Therapy, [phone]- Electrical stimulation (manual), 97016- Vasopneumatic device, Patient/Family education, Balance training, Stair training, Taping, Dry Needling, Joint mobilization, Joint manipulation, Cryotherapy, and Moist heat  PLAN FOR NEXT SESSION: Review HEP and progress PRN, continue quad activation to achieve active hyperextension, progress knee flexion PROM>AAROM>AROM, quad strengthening, BFR if time allows, vaso post session for edema   Leah Primus, PT, DPT, LAT, ATC 09/11/23  11:00 AM Phone: [phone] Fax: [phone]   PHYSICAL THERAPY DISCHARGE SUMMARY  Visits from Start of Care: 8  Current functional level related to goals / functional outcomes: See above   Remaining deficits: See above   Education / Equipment: HEP   Patient agrees to discharge. Patient goals were not met. Patient is being discharged due to not returning since the last visit.  Leah Primus, PT, DPT, LAT, ATC 10/24/23  10:31 AM Phone: [phone] Fax: [phone]

## 2023-09-11 ENCOUNTER — Ambulatory Visit: Payer: Medicaid Other | Admitting: Physical Therapy

## 2023-09-11 ENCOUNTER — Encounter: Payer: Self-pay | Admitting: Physical Therapy

## 2023-09-11 ENCOUNTER — Other Ambulatory Visit: Payer: Self-pay

## 2023-09-11 DIAGNOSIS — R600 Localized edema: Secondary | ICD-10-CM

## 2023-09-11 DIAGNOSIS — G8929 Other chronic pain: Secondary | ICD-10-CM

## 2023-09-11 DIAGNOSIS — M6281 Muscle weakness (generalized): Secondary | ICD-10-CM

## 2023-09-11 DIAGNOSIS — M25562 Pain in left knee: Secondary | ICD-10-CM | POA: Diagnosis not present

## 2023-09-16 ENCOUNTER — Ambulatory Visit: Payer: Medicaid Other | Admitting: Physical Therapy

## 2023-09-18 ENCOUNTER — Ambulatory Visit: Payer: Medicaid Other | Admitting: Physical Therapy
